# Patient Record
Sex: Female | Born: 1985
Health system: Southern US, Community
[De-identification: ages and names within clinical notes are randomized; demographics above are authoritative.]

## PROBLEM LIST (undated history)

## (undated) ENCOUNTER — Inpatient Hospital Stay (HOSPITAL_COMMUNITY): Payer: Self-pay

## (undated) DIAGNOSIS — L989 Disorder of the skin and subcutaneous tissue, unspecified: Secondary | ICD-10-CM

## (undated) DIAGNOSIS — H8091 Unspecified otosclerosis, right ear: Secondary | ICD-10-CM

## (undated) HISTORY — PX: STAPEDECTOMY: SHX2435

---

## 2013-11-21 ENCOUNTER — Encounter (HOSPITAL_COMMUNITY): Payer: Self-pay | Admitting: Emergency Medicine

## 2013-11-21 ENCOUNTER — Emergency Department (INDEPENDENT_AMBULATORY_CARE_PROVIDER_SITE_OTHER): Payer: Medicaid Other

## 2013-11-21 ENCOUNTER — Emergency Department (HOSPITAL_COMMUNITY)
Admission: EM | Admit: 2013-11-21 | Discharge: 2013-11-21 | Disposition: A | Discharge status: Home / Self Care | Payer: Medicaid Other | Source: Home / Self Care | Attending: Family Medicine | Admitting: Family Medicine

## 2013-11-21 DIAGNOSIS — H9209 Otalgia, unspecified ear: Secondary | ICD-10-CM

## 2013-11-21 DIAGNOSIS — S63509A Unspecified sprain of unspecified wrist, initial encounter: Secondary | ICD-10-CM

## 2013-11-21 DIAGNOSIS — W19XXXA Unspecified fall, initial encounter: Secondary | ICD-10-CM

## 2013-11-21 DIAGNOSIS — S63501A Unspecified sprain of right wrist, initial encounter: Secondary | ICD-10-CM

## 2013-11-21 DIAGNOSIS — H9201 Otalgia, right ear: Secondary | ICD-10-CM

## 2013-11-21 MED ORDER — CIPROFLOXACIN-DEXAMETHASONE 0.3-0.1 % OT SUSP: 4.0000 [drp] | Freq: Two times a day (BID) | OTIC | Status: DC

## 2013-11-21 MED ORDER — DIPHENHYDRAMINE HCL 50 MG/ML IJ SOLN
INTRAMUSCULAR | Status: AC
  Filled 2013-11-21: qty 1

## 2013-11-21 MED ORDER — DICLOFENAC SODIUM 50 MG PO TBEC: 50.0000 mg | DELAYED_RELEASE_TABLET | Freq: Two times a day (BID) | ORAL | Status: DC | PRN

## 2013-11-21 MED ORDER — ANTIPYRINE-BENZOCAINE 5.4-1.4 % OT SOLN: 3.0000 [drp] | OTIC | Status: DC | PRN

## 2013-11-21 NOTE — Discharge Instructions (Signed)
Thank you for coming in today. [facility].  Your doctor's office will need to authorize the referral.  GOTO THIS ADDRESS TODAY [facility] [address] [place], [place] [id number] [phone]  Use the ear drops for 1 week.   Use the brace for 1 week.  Take diclofenac the neck is needed for pain.   Otalgia The most common reason for this in children is an infection of the middle ear. Pain from the middle ear is usually caused by a build-up of fluid and pressure behind the eardrum. Pain from an earache can be sharp, dull, or burning. The pain may be temporary or constant. The middle ear is connected to the nasal passages by a short narrow tube called the Eustachian tube. The Eustachian tube allows fluid to drain out of the middle ear, and helps keep the pressure in your ear equalized. CAUSES  A cold or allergy can block the Eustachian tube with inflammation and the build-up of secretions. This is especially likely in small children, because their Eustachian tube is shorter and more horizontal. When the Eustachian tube closes, the normal flow of fluid from the middle ear is stopped. Fluid can accumulate and cause stuffiness, pain, hearing loss, and an ear infection if germs start growing in this area. SYMPTOMS  The symptoms of an ear infection may include fever, ear pain, fussiness, increased crying, and irritability. Many children will have temporary and minor hearing loss during and right after an ear infection. Permanent hearing loss is rare, but the risk increases the more infections a child has. Other causes of ear pain include retained water in the outer ear canal from swimming and bathing. Ear pain in adults is less likely to be from an ear infection. Ear pain may be referred from other locations. Referred pain may be from the joint between your jaw and the skull. It may also come from a tooth problem or problems in  the neck. Other causes of ear pain include:  A foreign body in the ear.  Outer ear infection.  Sinus infections.  Impacted ear wax.  Ear injury.  Arthritis of the jaw or TMJ problems.  Middle ear infection.  Tooth infections.  Sore throat with pain to the ears. DIAGNOSIS  Your caregiver can usually make the diagnosis by examining you. Sometimes other special studies, including x-rays and lab work may be necessary. TREATMENT   If antibiotics were prescribed, use them as directed and finish them even if you or your child's symptoms seem to be improved.  Sometimes PE tubes are needed in children. These are little plastic tubes which are put into the eardrum during a simple surgical procedure. They allow fluid to drain easier and allow the pressure in the middle ear to equalize. This helps relieve the ear pain caused by pressure changes. HOME CARE INSTRUCTIONS   Only take over-the-counter or prescription medicines for pain, discomfort, or fever as directed by your caregiver. DO NOT GIVE CHILDREN ASPIRIN because of the association of Reye's Syndrome in children taking aspirin.  Use a cold pack applied to the outer ear for 15-20 minutes, 03-04 times per day or as needed may reduce pain. Do not apply ice directly to the skin. You may cause frost bite.  Over-the-counter ear drops used as directed may be effective. Your caregiver may sometimes prescribe ear drops.  Resting in an upright position may help reduce pressure in the middle ear and relieve  pain.  Ear pain caused by rapidly descending from high altitudes can be relieved by swallowing or chewing gum. Allowing infants to suck on a bottle during airplane travel can help.  Do not smoke in the house or near children. If you are unable to quit smoking, smoke outside.  Control allergies. SEEK IMMEDIATE MEDICAL CARE IF:   You or your child are becoming sicker.  Pain or fever relief is not obtained with medicine.  You or your  child's symptoms (pain, fever, or irritability) do not improve within 24 to 48 hours or as instructed.  Severe pain suddenly stops hurting. This may indicate a ruptured eardrum.  You or your children develop new problems such as severe headaches, stiff neck, difficulty swallowing, or swelling of the face or around the ear. Document Released: 01/01/2004 Document Revised: 08/08/2011 Document Reviewed: 05/07/2008 [facility] Patient Information 2015 [employer] [place]. This information is not intended to replace advice given to you by your health care provider. Make sure you discuss any questions you have with your health care provider.   Wrist Sprain with Rehab A sprain is an injury in which a ligament that maintains the proper alignment of a joint is partially or completely torn. The ligaments of the wrist are susceptible to sprains. Sprains are classified into three categories. Grade 1 sprains cause pain, but the tendon is not lengthened. Grade 2 sprains include a lengthened ligament because the ligament is stretched or partially ruptured. With grade 2 sprains there is still function, although the function may be diminished. Grade 3 sprains are characterized by a complete tear of the tendon or muscle, and function is usually impaired. SYMPTOMS   Pain tenderness, inflammation, and/or bruising (contusion) of the injury.  A "pop" or tear felt and/or heard at the time of injury.  Decreased wrist function. CAUSES  A wrist sprain occurs when a force is placed on one or more ligaments that is greater than it/they can withstand. Common mechanisms of injury include:  Catching a ball with you hands.  Repetitive and/ or strenuous extension or flexion of the wrist. RISK INCREASES WITH:  Previous wrist injury.  Contact sports (boxing or wrestling).  Activities in which falling is common.  Poor strength and flexibility.  Improperly fitted or padded protective equipment. PREVENTION  Warm up and  stretch properly before activity.  Allow for adequate recovery between workouts.  Maintain physical fitness:  Strength, flexibility, and endurance.  Cardiovascular fitness.  Protect the wrist joint by limiting its motion with the use of taping, braces, or splints.  Protect the wrist after injury for 6 to 12 months. PROGNOSIS  The prognosis for wrist sprains depends on the degree of injury. Grade 1 sprains require 2 to 6 weeks of treatment. Grade 2 sprains require 6 to 8 weeks of treatment, and grade 3 sprains require up to 12 weeks.  RELATED COMPLICATIONS   Prolonged healing time, if improperly treated or re-injured.  Recurrent symptoms that result in a chronic problem.  Injury to nearby structures (bone, cartilage, nerves, or tendons).  Arthritis of the wrist.  Inability to compete in athletics at a high level.  Wrist stiffness or weakness.  Progression to a complete rupture of the ligament. TREATMENT  Treatment initially involves resting from any activities that aggravate the symptoms, and the use of ice and medications to help reduce pain and inflammation. Your caregiver may recommend immobilizing the wrist for a period of time in order to reduce stress on the ligament and allow for healing. After  immobilization it is important to perform strengthening and stretching exercises to help regain strength and a full range of motion. These exercises may be completed at home or with a therapist. Surgery is not usually required for wrist sprains, unless the ligament has been ruptured (grade 3 sprain). MEDICATION   If pain medication is necessary, then nonsteroidal anti-inflammatory medications, such as aspirin and ibuprofen, or other minor pain relievers, such as acetaminophen, are often recommended.  Do not take pain medication for 7 days before surgery.  Prescription pain relievers may be given if deemed necessary by your caregiver. Use only as directed and only as much as you  need. HEAT AND COLD  Cold treatment (icing) relieves pain and reduces inflammation. Cold treatment should be applied for 10 to 15 minutes every 2 to 3 hours for inflammation and pain and immediately after any activity that aggravates your symptoms. Use ice packs or massage the area with a piece of ice (ice massage).  Heat treatment may be used prior to performing the stretching and strengthening activities prescribed by your caregiver, physical therapist, or athletic trainer. Use a heat pack or soak your injury in warm water. SEEK MEDICAL CARE IF:  Treatment seems to offer no benefit, or the condition worsens.  Any medications produce adverse side effects. EXERCISES RANGE OF MOTION (ROM) AND STRETCHING EXERCISES - Wrist Sprain  These exercises may help you when beginning to rehabilitate your injury. Your symptoms may resolve with or without further involvement from your physician, physical therapist or athletic trainer. While completing these exercises, remember:   Restoring tissue flexibility helps normal motion to return to the joints. This allows healthier, less painful movement and activity.  An effective stretch should be held for at least 30 seconds.  A stretch should never be painful. You should only feel a gentle lengthening or release in the stretched tissue. RANGE OF MOTION - Wrist Flexion, Active-Assisted  Extend your right / left elbow with your fingers pointing down.*  Gently pull the back of your hand towards you until you feel a gentle stretch on the top of your forearm.  Hold this position for __________ seconds. Repeat __________ times. Complete this exercise __________ times per day.  *If directed by your physician, physical therapist or athletic trainer, complete this stretch with your elbow bent rather than extended. RANGE OF MOTION - Wrist Extension, Active-Assisted  Extend your right / left elbow and turn your palm upwards.*  Gently pull your palm/fingertips  back so your wrist extends and your fingers point more toward the ground.  You should feel a gentle stretch on the inside of your forearm.  Hold this position for __________ seconds. Repeat __________ times. Complete this exercise __________ times per day. *If directed by your physician, physical therapist or athletic trainer, complete this stretch with your elbow bent, rather than extended. RANGE OF MOTION - Supination, Active  Stand or sit with your elbows at your side. Bend your right / left elbow to 90 degrees.  Turn your palm upward until you feel a gentle stretch on the inside of your forearm.  Hold this position for __________ seconds. Slowly release and return to the starting position. Repeat __________ times. Complete this stretch __________ times per day.  RANGE OF MOTION - Pronation, Active  Stand or sit with your elbows at your side. Bend your right / left elbow to 90 degrees.  Turn your palm downward until you feel a gentle stretch on the top of your forearm.  Hold this  position for __________ seconds. Slowly release and return to the starting position. Repeat __________ times. Complete this stretch __________ times per day.  STRETCH - Wrist Flexion  Place the back of your right / left hand on a tabletop leaving your elbow slightly bent. Your fingers should point away from your body.  Gently press the back of your hand down onto the table by straightening your elbow. You should feel a stretch on the top of your forearm.  Hold this position for __________ seconds. Repeat __________ times. Complete this stretch __________ times per day.  STRETCH - Wrist Extension  Place your right / left fingertips on a tabletop leaving your elbow slightly bent. Your fingers should point backwards.  Gently press your fingers and palm down onto the table by straightening your elbow. You should feel a stretch on the inside of your forearm.  Hold this position for __________  seconds. Repeat __________ times. Complete this stretch __________ times per day.  STRENGTHENING EXERCISES - Wrist Sprain These exercises may help you when beginning to rehabilitate your injury. They may resolve your symptoms with or without further involvement from your physician, physical therapist or athletic trainer. While completing these exercises, remember:   Muscles can gain both the endurance and the strength needed for everyday activities through controlled exercises.  Complete these exercises as instructed by your physician, physical therapist or athletic trainer. Progress with the resistance and repetition exercises only as your caregiver advises. STRENGTH - Wrist Flexors  Sit with your right / left forearm palm-up and fully supported. Your elbow should be resting below the height of your shoulder. Allow your wrist to extend over the edge of the surface.  Loosely holding a __________ weight or a piece of rubber exercise band/tubing, slowly curl your hand up toward your forearm.  Hold this position for __________ seconds. Slowly lower the wrist back to the starting position in a controlled manner. Repeat __________ times. Complete this exercise __________ times per day.  STRENGTH - Wrist Extensors  Sit with your right / left forearm palm-down and fully supported. Your elbow should be resting below the height of your shoulder. Allow your wrist to extend over the edge of the surface.  Loosely holding a __________ weight or a piece of rubber exercise band/tubing, slowly curl your hand up toward your forearm.  Hold this position for __________ seconds. Slowly lower the wrist back to the starting position in a controlled manner. Repeat __________ times. Complete this exercise __________ times per day.  STRENGTH - Ulnar Deviators  Stand with a ____________________ weight in your right / left hand, or sit holding on to the rubber exercise band/tubing with your opposite arm  supported.  Move your wrist so that your pinkie travels toward your forearm and your thumb moves away from your forearm.  Hold this position for __________ seconds and then slowly lower the wrist back to the starting position. Repeat __________ times. Complete this exercise __________ times per day STRENGTH - Radial Deviators  Stand with a ____________________ weight in your  right / left hand, or sit holding on to the rubber exercise band/tubing with your arm supported.  Raise your hand upward in front of you or pull up on the rubber tubing.  Hold this position for __________ seconds and then slowly lower the wrist back to the starting position. Repeat __________ times. Complete this exercise __________ times per day. STRENGTH - Forearm Supinators  Sit with your right / left forearm supported on a table, keeping  your elbow below shoulder height. Rest your hand over the edge, palm down.  Gently grip a hammer or a soup ladle.  Without moving your elbow, slowly turn your palm and hand upward to a "thumbs-up" position.  Hold this position for __________ seconds. Slowly return to the starting position. Repeat __________ times. Complete this exercise __________ times per day.  STRENGTH - Forearm Pronators  Sit with your right / left forearm supported on a table, keeping your elbow below shoulder height. Rest your hand over the edge, palm up.  Gently grip a hammer or a soup ladle.  Without moving your elbow, slowly turn your palm and hand upward to a "thumbs-up" position.  Hold this position for __________ seconds. Slowly return to the starting position. Repeat __________ times. Complete this exercise __________ times per day.  STRENGTH - Grip  Grasp a tennis ball, a dense sponge, or a large, rolled sock in your hand.  Squeeze as hard as you can without increasing any pain.  Hold this position for __________ seconds. Release your grip slowly. Repeat __________ times. Complete  this exercise __________ times per day.  Document Released: 05/16/2005 Document Revised: 08/08/2011 Document Reviewed: 08/28/2008 [facility] Patient Information 2015 [place], [place]. This information is not intended to replace advice given to you by your health care provider. Make sure you discuss any questions you have with your health care provider.

## 2013-11-21 NOTE — ED Notes (Signed)
C/o ear pain See physician note

## 2013-11-21 NOTE — ED Provider Notes (Signed)
Jonai Fredda Hammedal Almodars is a 28 y.o. female who presents to Urgent Care today for right wrist and right ear pain.  1) right wrist pain: Patient fell onto an outstretched right wrist yesterday. She notes pain at the distal radius. The pain is worse with activity better with rest. No radiating pain weakness or numbness. No medications tried  2) right ear pain: Patient is worsening right ear pain and decreased hearing following a fall yesterday where she hit the right side of her head.   History reviewed. No pertinent past medical history. History  Substance Use Topics  . Smoking status: Not on file  . Smokeless tobacco: Not on file  . Alcohol Use: Not on file   ROS as above Medications: Current Facility-Administered Medications  Medication Dose Route Frequency Provider Last Rate Last Dose  . antipyrine-benzocaine (AURALGAN) otic solution 3-4 drop  3-4 drop Right Ear Q2H PRN Rodolph BongEvan S Corey, MD       Current Outpatient Prescriptions  Medication Sig Dispense Refill  . ciprofloxacin-dexamethasone (CIPRODEX) otic suspension Place 4 drops into the right ear 2 (two) times daily. 1 week  7.5 mL  0  . diclofenac (VOLTAREN) 50 MG EC tablet Take 1 tablet (50 mg total) by mouth 2 (two) times daily as needed.  60 tablet  0    Exam:  BP 116/83  Pulse 81  Temp(Src) 98.1 F (36.7 C) (Oral)  Resp 16  SpO2 100%  LMP 11/20/2013 Gen: Well NAD HEENT: EOMI,  MMM right ear canal is normal-appearing. The tympanic membrane is retracted but otherwise normal appearing. Patient has mild tenderness with motion of the ear. Nontender mastoids. No bruits in seen. Left ear is normal appearing. Lungs: Normal work of breathing. CTABL Heart: RRR no MRG Abd: NABS, Soft. NT, ND Exts: Brisk capillary refill, warm and well perfused.  Right wrist: Normal-appearing no swelling or ecchymosis. Tender palpation distal radius. Nontender anatomical snuff box. Grip strength Refill pulses and sensation are intact. Motion is  intact.  No results found for this or any previous visit (from the past 24 hour(s)). Dg Wrist Complete Right  11/21/2013   CLINICAL DATA:  Right wrist pain after fall.  EXAM: RIGHT WRIST - COMPLETE 3+ VIEW  COMPARISON:  None.  FINDINGS: There is no evidence of fracture or dislocation. There is no evidence of arthropathy or other focal bone abnormality. Soft tissues are unremarkable.  IMPRESSION: Normal right wrist.   Electronically Signed   By: Roque LiasJames  Green M.D.   On: 11/21/2013 12:23    Assessment and Plan: 28 y.o. female with  1) right wrist sprain: No evidence of fracture. Nontender in the snuff box. Plan for wrist brace and diclofenac as needed 2) right ear pain: Unclear etiology. We'll try to use Ciprodex eardrops as this may help some. [facility].  Discussed warning signs or symptoms. Please see discharge instructions. Patient expresses understanding.    Rodolph BongEvan S Corey, MD 11/21/13 1321

## 2013-12-09 ENCOUNTER — Encounter (HOSPITAL_COMMUNITY): Payer: Self-pay | Admitting: Emergency Medicine

## 2013-12-09 ENCOUNTER — Emergency Department (HOSPITAL_COMMUNITY)
Admission: EM | Admit: 2013-12-09 | Discharge: 2013-12-09 | Disposition: A | Discharge status: Home / Self Care | Payer: Medicaid Other | Attending: Emergency Medicine | Admitting: Emergency Medicine

## 2013-12-09 DIAGNOSIS — M25531 Pain in right wrist: Secondary | ICD-10-CM

## 2013-12-09 DIAGNOSIS — M79609 Pain in unspecified limb: Secondary | ICD-10-CM | POA: Diagnosis present

## 2013-12-09 DIAGNOSIS — M25539 Pain in unspecified wrist: Secondary | ICD-10-CM | POA: Diagnosis not present

## 2013-12-09 DIAGNOSIS — M25532 Pain in left wrist: Secondary | ICD-10-CM

## 2013-12-09 DIAGNOSIS — L723 Sebaceous cyst: Secondary | ICD-10-CM | POA: Diagnosis not present

## 2013-12-09 MED ORDER — MELOXICAM 7.5 MG PO TABS: 15.0000 mg | ORAL_TABLET | Freq: Every day | ORAL | Status: DC

## 2013-12-09 NOTE — ED Notes (Signed)
Presents with one year of cysts on posterior bilateral hands causing pain.

## 2013-12-09 NOTE — Discharge Instructions (Signed)
Arthralgia °Your caregiver has diagnosed you as suffering from an arthralgia. Arthralgia means there is pain in a joint. This can come from many reasons including: °· Bruising the joint which causes soreness (inflammation) in the joint. °· Wear and tear on the joints which occur as we grow older (osteoarthritis). °· Overusing the joint. °· Various forms of arthritis. °· Infections of the joint. °Regardless of the cause of pain in your joint, most of these different pains respond to anti-inflammatory drugs and rest. The exception to this is when a joint is infected, and these cases are treated with antibiotics, if it is a bacterial infection. °HOME CARE INSTRUCTIONS  °· Rest the injured area for as long as directed by your caregiver. Then slowly start using the joint as directed by your caregiver and as the pain allows. Crutches as directed may be useful if the ankles, knees or hips are involved. If the knee was splinted or casted, continue use and care as directed. If an stretchy or elastic wrapping bandage has been applied today, it should be removed and re-applied every 3 to 4 hours. It should not be applied tightly, but firmly enough to keep swelling down. Watch toes and feet for swelling, bluish discoloration, coldness, numbness or excessive pain. If any of these problems (symptoms) occur, remove the ace bandage and re-apply more loosely. If these symptoms persist, contact your caregiver or return to this location. °· For the first 24 hours, keep the injured extremity elevated on pillows while lying down. °· Apply ice for 15-20 minutes to the sore joint every couple hours while awake for the first half day. Then 03-04 times per day for the first 48 hours. Put the ice in a plastic bag and place a towel between the bag of ice and your skin. °· Wear any splinting, casting, elastic bandage applications, or slings as instructed. °· Only take over-the-counter or prescription medicines for pain, discomfort, or fever as  directed by your caregiver. Do not use aspirin immediately after the injury unless instructed by your physician. Aspirin can cause increased bleeding and bruising of the tissues. °· If you were given crutches, continue to use them as instructed and do not resume weight bearing on the sore joint until instructed. °Persistent pain and inability to use the sore joint as directed for more than 2 to 3 days are warning signs indicating that you should see a caregiver for a follow-up visit as soon as possible. Initially, a hairline fracture (break in bone) may not be evident on X-rays. Persistent pain and swelling indicate that further evaluation, non-weight bearing or use of the joint (use of crutches or slings as instructed), or further X-rays are indicated. X-rays may sometimes not show a small fracture until a week or 10 days later. Make a follow-up appointment with your own caregiver or one to whom we have referred you. A radiologist (specialist in reading X-rays) may read your X-rays. Make sure you know how you are to obtain your X-ray results. Do not assume everything is normal if you do not hear from us. °SEEK MEDICAL CARE IF: °Bruising, swelling, or pain increases. °SEEK IMMEDIATE MEDICAL CARE IF:  °· Your fingers or toes are numb or blue. °· The pain is not responding to medications and continues to stay the same or get worse. °· The pain in your joint becomes severe. °· You develop a fever over 102° F (38.9° C). °· It becomes impossible to move or use the joint. °MAKE SURE YOU:  °·   Understand these instructions.  Will watch your condition.  Will get help right away if you are not doing well or get worse. Document Released: 05/16/2005 Document Revised: 08/08/2011 Document Reviewed: 01/02/2008 [facility] Patient Information 2015 [place], [place]. This information is not intended to replace advice given to you by your health care provider. Make sure you discuss any questions you have with your health care  provider. Ganglion Cyst A ganglion cyst is a noncancerous, fluid-filled lump that occurs near joints or tendons. The ganglion cyst grows out of a joint or the lining of a tendon. It most often develops in the hand or wrist but can also develop in the shoulder, elbow, hip, knee, ankle, or foot. The round or oval ganglion can be pea sized or larger than a grape. Increased activity may enlarge the size of the cyst because more fluid starts to build up.  CAUSES  It is not completely known what causes a ganglion cyst to grow. However, it may be related to:  Inflammation or irritation around the joint.  An injury.  Repetitive movements or overuse.  Arthritis. SYMPTOMS  A lump most often appears in the hand or wrist, but can occur in other areas of the body. Generally, the lump is painless without other symptoms. However, sometimes pain can be felt during activity or when pressure is applied to the lump. The lump may even be tender to the touch. Tingling, pain, numbness, or muscle weakness can occur if the ganglion cyst presses on a nerve. Your grip may be weak and you may have less movement in your joints.  DIAGNOSIS  Ganglion cysts are most often diagnosed based on a physical exam, noting where the cyst is and how it looks. Your caregiver will feel the lump and may shine a light alongside it. If it is a ganglion, a light often shines through it. Your caregiver may order an X-ray, ultrasound, or MRI to rule out other conditions. TREATMENT  Ganglions usually go away on their own without treatment. If pain or other symptoms are involved, treatment may be needed. Treatment is also needed if the ganglion limits your movement or if it gets infected. Treatment options include:  Wearing a wrist or finger brace or splint.  Taking anti-inflammatory medicine.  Draining fluid from the lump with a needle (aspiration).  Injecting a steroid into the joint.  Surgery to remove the ganglion cyst and its stalk  that is attached to the joint or tendon. However, ganglion cysts can grow back. HOME CARE INSTRUCTIONS   Do not press on the ganglion, poke it with a needle, or hit it with a heavy object. You may rub the lump gently and often. Sometimes fluid moves out of the cyst.  Only take medicines as directed by your caregiver.  Wear your brace or splint as directed by your caregiver. SEEK MEDICAL CARE IF:   Your ganglion becomes larger or more painful.  You have increased redness, red streaks, or swelling.  You have pus coming from the lump.  You have weakness or numbness in the affected area. MAKE SURE YOU:   Understand these instructions.  Will watch your condition.  Will get help right away if you are not doing well or get worse. Document Released: 05/13/2000 Document Revised: 02/08/2012 Document Reviewed: 07/10/2007 [facility] Patient Information 2015 [place]ExitCare, [place]LL[place]. This information is not intended to replace advice given to you by your health care provider. Make sure you discuss any questions you have with your health care provider.

## 2013-12-09 NOTE — ED Provider Notes (Signed)
CSN: [id number]     Arrival date & time 12/09/13  2206 History  This chart was scribed for non-physician practitioner, Roxy Horsemanobert Ianmichael Amescua, PA-C working with Derwood KaplanAnkit Nanavati, MD by Greggory StallionKayla Andersen, ED scribe. This patient was seen in room TR11C/TR11C and the patient's care was started at 10:49 PM.   Chief Complaint  Patient presents with  . Hand Pain   The history is provided by the patient. A language interpreter was used (pt's husband).   HPI Comments: Terri Simpson is a 28 y.o. female who presents to the Emergency Department complaining of cysts to bilateral hands that started one year ago. States they have recently grown in size and increased in pain, right worse than left. Movements worsen pain. Pt went to an urgent care one month ago where an xray was done. She was referred to a specialist but was told she couldn't be seen there because she had medicaid. Pt is also complaining of intermittent right foot pain that started one year ago after breaking a bone.   History reviewed. No pertinent past medical history. History reviewed. No pertinent past surgical history. History reviewed. No pertinent family history. History  Substance Use Topics  . Smoking status: Not on file  . Smokeless tobacco: Not on file  . Alcohol Use: Not on file   OB History   Grav Para Term Preterm Abortions TAB SAB Ect Mult Living                 Review of Systems  Constitutional: Negative for fever.  HENT: Negative for congestion.   Eyes: Negative for redness.  Respiratory: Negative for shortness of breath.   Cardiovascular: Negative for chest pain.  Gastrointestinal: Negative for abdominal distention.  Musculoskeletal: Positive for arthralgias.  Skin: Negative for rash.       Cysts.  Neurological: Negative for speech difficulty.  Psychiatric/Behavioral: Negative for confusion.   Allergies  Review of patient's allergies indicates no known allergies.  Home Medications   Prior to Admission  medications   Not on File   BP 105/77  Pulse 92  Temp(Src) 97.7 F (36.5 C) (Oral)  Resp 16  Ht 5' (1.524 m)  Wt 160 lb (72.576 kg)  BMI 31.25 kg/m2  SpO2 100%  LMP 11/20/2013  Physical Exam  Nursing note and vitals reviewed. Constitutional: She is oriented to person, place, and time. She appears well-developed and well-nourished. No distress.  HENT:  Head: Normocephalic and atraumatic.  Eyes: Conjunctivae and EOM are normal.  Cardiovascular: Normal rate, regular rhythm and intact distal pulses.   Brisk capillary refill.  Pulmonary/Chest: Effort normal and breath sounds normal. No stridor. No respiratory distress.  Abdominal: She exhibits no distension.  Musculoskeletal: She exhibits no edema.  Posterior bilateral wrists are tender to palpation. No obvious bony abnormality or deformity. Possibly a ganglion cyst. ROM and strength 5/5.   Neurological: She is alert and oriented to person, place, and time. No cranial nerve deficit.  Sensation intact.  Skin: Skin is warm and dry.  Psychiatric: She has a normal mood and affect.    ED Course  Procedures (including critical care time)  DIAGNOSTIC STUDIES: Oxygen Saturation is 100% on RA, normal by my interpretation.    COORDINATION OF CARE: 10:52 PM-Discussed treatment plan which includes pain medication with pt at bedside and pt agreed to plan. Will give pt hand surgery referral and advised her to follow up.   Labs Review Labs Reviewed - No data to display  Imaging Review No results  found.   EKG Interpretation None      MDM   Final diagnoses:  Pain in both wrists    Patient with wrist pain. No bony deformity. No evidence of infection, or abscess. Doubt carpal tunnel. Recommend orthopedic followup.  I personally performed the services described in this documentation, which was scribed in my presence. The recorded information has been reviewed and is accurate.  Roxy Horseman, PA-C 12/10/13 [phone]

## 2013-12-10 NOTE — ED Provider Notes (Signed)
Medical screening examination/treatment/procedure(s) were performed by non-physician practitioner and as supervising physician I was immediately available for consultation/collaboration.   EKG Interpretation None       Derwood KaplanAnkit Tyger Oka, MD 12/10/13 0134

## 2014-03-29 ENCOUNTER — Emergency Department (HOSPITAL_COMMUNITY)
Admission: EM | Admit: 2014-03-29 | Discharge: 2014-03-29 | Disposition: A | Discharge status: Home / Self Care | Payer: Medicaid Other | Source: Home / Self Care

## 2014-03-29 ENCOUNTER — Encounter (HOSPITAL_COMMUNITY): Payer: Self-pay | Admitting: Emergency Medicine

## 2014-03-29 DIAGNOSIS — J029 Acute pharyngitis, unspecified: Secondary | ICD-10-CM

## 2014-03-29 DIAGNOSIS — R0982 Postnasal drip: Secondary | ICD-10-CM

## 2014-03-29 LAB — POCT RAPID STREP A: STREPTOCOCCUS, GROUP A SCREEN (DIRECT): NEGATIVE

## 2014-03-29 MED ORDER — TRAMADOL HCL 50 MG PO TABS: 50.0000 mg | ORAL_TABLET | Freq: Four times a day (QID) | ORAL | Status: DC | PRN

## 2014-03-29 NOTE — Discharge Instructions (Signed)
Pharyngitis Cepacol lozenges Lots of cool liquids Ibuprofen 600 mg every 6 hours as needed May continue the Nyquil Tramadol for pain Pharyngitis is redness, pain, and swelling (inflammation) of your pharynx.  CAUSES  Pharyngitis is usually caused by infection. Most of the time, these infections are from viruses (viral) and are part of a cold. However, sometimes pharyngitis is caused by bacteria (bacterial). Pharyngitis can also be caused by allergies. Viral pharyngitis may be spread from person to person by coughing, sneezing, and personal items or utensils (cups, forks, spoons, toothbrushes). Bacterial pharyngitis may be spread from person to person by more intimate contact, such as kissing.  SIGNS AND SYMPTOMS  Symptoms of pharyngitis include:   Sore throat.   Tiredness (fatigue).   Low-grade fever.   Headache.  Joint pain and muscle aches.  Skin rashes.  Swollen lymph nodes.  Plaque-like film on throat or tonsils (often seen with bacterial pharyngitis). DIAGNOSIS  Your health care provider will ask you questions about your illness and your symptoms. Your medical history, along with a physical exam, is often all that is needed to diagnose pharyngitis. Sometimes, a rapid strep test is done. Other lab tests may also be done, depending on the suspected cause.  TREATMENT  Viral pharyngitis will usually get better in 3-4 days without the use of medicine. Bacterial pharyngitis is treated with medicines that kill germs (antibiotics).  HOME CARE INSTRUCTIONS   Drink enough water and fluids to keep your urine clear or pale yellow.   Only take over-the-counter or prescription medicines as directed by your health care provider:   If you are prescribed antibiotics, make sure you finish them even if you start to feel better.   Do not take aspirin.   Get lots of rest.   Gargle with 8 oz of salt water ( tsp of salt per 1 qt of water) as often as every 1-2 hours to soothe your  throat.   Throat lozenges (if you are not at risk for choking) or sprays may be used to soothe your throat. SEEK MEDICAL CARE IF:   You have large, tender lumps in your neck.  You have a rash.  You cough up green, yellow-brown, or bloody spit. SEEK IMMEDIATE MEDICAL CARE IF:   Your neck becomes stiff.  You drool or are unable to swallow liquids.  You vomit or are unable to keep medicines or liquids down.  You have severe pain that does not go away with the use of recommended medicines.  You have trouble breathing (not caused by a stuffy nose). MAKE SURE YOU:   Understand these instructions.  Will watch your condition.  Will get help right away if you are not doing well or get worse. Document Released: 05/16/2005 Document Revised: 03/06/2013 Document Reviewed: 01/21/2013 [facility]ExitCare Patient Information 2015 [place]ExitCare, [place]LLC. This information is not intended to replace advice given to you by your health care provider. Make sure you discuss any questions you have with your health care provider.  Salt Water Gargle This solution will help make your mouth and throat feel better. HOME CARE INSTRUCTIONS   Mix 1 teaspoon of salt in 8 ounces of warm water.  Gargle with this solution as much or often as you need or as directed. Swish and gargle gently if you have any sores or wounds in your mouth.  Do not swallow this mixture. Document Released: 02/18/2004 Document Revised: 08/08/2011 Document Reviewed: 07/11/2008 [facility] Patient Information 2015 [place]ExitCare, [place]LLC. This information is not intended to replace advice  given to you by your health care provider. Make sure you discuss any questions you have with your health care provider.

## 2014-03-29 NOTE — ED Provider Notes (Signed)
Medical screening examination/treatment/procedure(s) were performed by resident physician or non-physician practitioner and as supervising physician I was immediately available for consultation/collaboration.   Lancer Thurner DOUGLAS MD.   Will Schier D Amoreena Neubert, MD 03/29/14 1345 

## 2014-03-29 NOTE — ED Notes (Signed)
Pt states that for 2 days she has had a sore throat she has been barely been able to eat drink r/t having a sore throat. Pt is in no acute distress at this time.

## 2014-03-29 NOTE — ED Provider Notes (Signed)
CSN: [id number]     Arrival date & time 03/29/14  1201 History   First MD Initiated Contact with Patient 03/29/14 1303     Chief Complaint  Patient presents with  . Sore Throat   (Consider location/radiation/quality/duration/timing/severity/associated sxs/prior Treatment) HPI Comments: 28 year old female accompanied by husband complaining of a sore throat for 2 days. Husband states that she feels as though she has had a fever but is not been measured. The pain in the throat keeps her from sleeping. She also complains of PND, dry throat that is worse in the morning. She has a recent history of an ear infection for which she had surgery.   History reviewed. No pertinent past medical history. History reviewed. No pertinent past surgical history. History reviewed. No pertinent family history. History  Substance Use Topics  . Smoking status: Never Smoker   . Smokeless tobacco: Not on file  . Alcohol Use: No   OB History   Grav Para Term Preterm Abortions TAB SAB Ect Mult Living                 Review of Systems  Constitutional: Positive for fever, activity change, appetite change and fatigue. Negative for chills.  HENT: Positive for postnasal drip, rhinorrhea and sore throat. Negative for facial swelling.   Eyes: Negative.   Respiratory: Negative.   Cardiovascular: Negative.   Musculoskeletal: Negative for neck pain and neck stiffness.  Skin: Negative for pallor and rash.  Neurological: Negative.     Allergies  Review of patient's allergies indicates no known allergies.  Home Medications   Prior to Admission medications   Medication Sig Start Date End Date Taking? Authorizing Provider  traMADol (ULTRAM) 50 MG tablet Take 1 tablet (50 mg total) by mouth every 6 (six) hours as needed. For throat pain 03/29/14   Hayden Rasmussenavid Jamie Belger, NP   BP 111/67  Pulse 74  Temp(Src) 97.9 F (36.6 C) (Oral)  Resp 16  SpO2 100%  LMP 03/23/2014 Physical Exam  Nursing note and vitals  reviewed. Constitutional: She is oriented to person, place, and time. She appears well-developed and well-nourished. No distress.  HENT:  Mouth/Throat: No oropharyngeal exudate.  Bilateral TMs are pearly gray transplant and without signs of effusion. There is mild retraction bilaterally.  Oral pharynx is injected. There is no swelling or tonsillar enlargement. Scant clear PND  Eyes: Conjunctivae and EOM are normal.  Neck: Normal range of motion. Neck supple.  Cardiovascular: Normal rate, regular rhythm and normal heart sounds.   Pulmonary/Chest: Effort normal and breath sounds normal. No respiratory distress. She has no wheezes. She has no rales.  Musculoskeletal: Normal range of motion. She exhibits no edema.  Lymphadenopathy:    She has no cervical adenopathy.  Neurological: She is alert and oriented to person, place, and time.  Skin: Skin is warm and dry. No rash noted.  Psychiatric: She has a normal mood and affect.    ED Course  Procedures (including critical care time) Labs Review Labs Reviewed  POCT RAPID STREP A ([facility] CARE ONLY)   Results for orders placed during the hospital encounter of 03/29/14  POCT RAPID STREP A ([facility] ONLY)      Result Value Ref Range   Streptococcus, Group A Screen (Direct) NEGATIVE  NEGATIVE     Imaging Review No results found.   MDM   1. Pharyngitis   2. PND (post-nasal drip)    Throat culture pending Appearance is more viral in nature Tramadol 50 mg #  15 prn pain  Cepacol lozenges Lots of cool liquids Ibuprofen 600 mg every 6 hours as needed May continue the Nyquil Tramadol for pain     Hayden Rasmussenavid Gowri Suchan, NP 03/29/14 1323

## 2014-03-30 DIAGNOSIS — H8091 Unspecified otosclerosis, right ear: Secondary | ICD-10-CM

## 2014-03-30 HISTORY — DX: Unspecified otosclerosis, right ear: H80.91

## 2014-03-31 LAB — CULTURE, GROUP A STREP

## 2014-04-17 ENCOUNTER — Ambulatory Visit: Payer: Self-pay | Admitting: Otolaryngology

## 2014-04-17 NOTE — H&P (Signed)
  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 Reason For Visit  Follow up from stapedectomy. Discussed  Doing great, very pleased with the results. The left tympanic membrane looks excellent. Audiogram revealed significant improvement in the conductive hearing loss. Residual conductive loss on the right. She would like to have the right side done before her Medicaid runs out in December. I think this is reasonable. We will schedule. Allergies  No Known Drug Allergies. Current Meds  Naproxen 500 MG Oral Tablet;; RPT Diazepam 5 MG Oral Tablet;take one tablet as needed for severe dizziness, up to 3 times daily.; Rx. 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 Signature  Electronically signed by : Serena ColonelJefry  Selicia Windom  M.D.; 03/07/2014 3:43 PM EST.

## 2014-04-21 ENCOUNTER — Encounter (HOSPITAL_BASED_OUTPATIENT_CLINIC_OR_DEPARTMENT_OTHER): Payer: Self-pay | Admitting: *Deleted

## 2014-04-21 DIAGNOSIS — L989 Disorder of the skin and subcutaneous tissue, unspecified: Secondary | ICD-10-CM

## 2014-04-21 HISTORY — DX: Disorder of the skin and subcutaneous tissue, unspecified: L98.9

## 2014-04-21 NOTE — Pre-Procedure Instructions (Signed)
Arabic interpreter req. for [facility]- and post-op [facility]; [phone] - [phone]

## 2014-04-22 NOTE — Pre-Procedure Instructions (Signed)
Suzi RootsFeryal will be interpreter for pt., per Darel HongJudy at [facility]; please call [phone] if surgery time changes.

## 2014-04-28 ENCOUNTER — Ambulatory Visit (HOSPITAL_BASED_OUTPATIENT_CLINIC_OR_DEPARTMENT_OTHER): Payer: Medicaid Other | Admitting: Anesthesiology

## 2014-04-28 ENCOUNTER — Encounter (HOSPITAL_BASED_OUTPATIENT_CLINIC_OR_DEPARTMENT_OTHER)
Admission: RE | Disposition: A | Discharge status: Home / Self Care | Payer: Self-pay | Source: Ambulatory Visit | Attending: Otolaryngology

## 2014-04-28 ENCOUNTER — Encounter (HOSPITAL_BASED_OUTPATIENT_CLINIC_OR_DEPARTMENT_OTHER): Payer: Self-pay | Admitting: Anesthesiology

## 2014-04-28 ENCOUNTER — Ambulatory Visit (HOSPITAL_BASED_OUTPATIENT_CLINIC_OR_DEPARTMENT_OTHER)
Admission: RE | Admit: 2014-04-28 | Discharge: 2014-04-29 | Disposition: A | Discharge status: Home / Self Care | Payer: Medicaid Other | Source: Ambulatory Visit | Attending: Otolaryngology | Admitting: Otolaryngology

## 2014-04-28 DIAGNOSIS — H8090 Unspecified otosclerosis, unspecified ear: Secondary | ICD-10-CM | POA: Diagnosis present

## 2014-04-28 DIAGNOSIS — H8091 Unspecified otosclerosis, right ear: Secondary | ICD-10-CM | POA: Insufficient documentation

## 2014-04-28 DIAGNOSIS — H908 Mixed conductive and sensorineural hearing loss, unspecified: Secondary | ICD-10-CM | POA: Diagnosis not present

## 2014-04-28 HISTORY — DX: Disorder of the skin and subcutaneous tissue, unspecified: L98.9

## 2014-04-28 HISTORY — PX: STAPEDECTOMY: SHX2435

## 2014-04-28 HISTORY — DX: Unspecified otosclerosis, right ear: H80.91

## 2014-04-28 LAB — POCT HEMOGLOBIN-HEMACUE: Hemoglobin: 13.1 g/dL (ref 12.0–15.0)

## 2014-04-28 SURGERY — STAPEDECTOMY
Anesthesia: General | Laterality: Right

## 2014-04-28 MED ORDER — ESMOLOL HCL 10 MG/ML IV SOLN
INTRAVENOUS | Status: DC | PRN
  Administered 2014-04-28: 10 mg via INTRAVENOUS

## 2014-04-28 MED ORDER — FENTANYL CITRATE 0.05 MG/ML IJ SOLN: 50.0000 ug | INTRAMUSCULAR | Status: DC | PRN

## 2014-04-28 MED ORDER — LACTATED RINGERS IV SOLN: INTRAVENOUS | Status: DC

## 2014-04-28 MED ORDER — METHYLENE BLUE 1 % INJ SOLN
INTRAMUSCULAR | Status: AC
  Filled 2014-04-28: qty 10

## 2014-04-28 MED ORDER — MIDAZOLAM HCL 2 MG/ML PO SYRP: 0.5000 mg/kg | ORAL_SOLUTION | Freq: Once | ORAL | Status: AC | PRN

## 2014-04-28 MED ORDER — EPINEPHRINE HCL 1 MG/ML IJ SOLN
INTRAMUSCULAR | Status: AC
  Filled 2014-04-28: qty 1

## 2014-04-28 MED ORDER — LIDOCAINE HCL (CARDIAC) 20 MG/ML IV SOLN
INTRAVENOUS | Status: DC | PRN
  Administered 2014-04-28: 50 mg via INTRAVENOUS

## 2014-04-28 MED ORDER — CIPROFLOXACIN-DEXAMETHASONE 0.3-0.1 % OT SUSP
OTIC | Status: AC
  Filled 2014-04-28: qty 7.5

## 2014-04-28 MED ORDER — EPINEPHRINE HCL 1 MG/ML IJ SOLN
INTRAMUSCULAR | Status: DC | PRN
  Administered 2014-04-28: .25 mL

## 2014-04-28 MED ORDER — CIPROFLOXACIN-DEXAMETHASONE 0.3-0.1 % OT SUSP
OTIC | Status: DC | PRN
  Administered 2014-04-28: 4 [drp] via OTIC

## 2014-04-28 MED ORDER — OXYCODONE HCL 5 MG PO TABS: 5.0000 mg | ORAL_TABLET | Freq: Once | ORAL | Status: AC | PRN

## 2014-04-28 MED ORDER — PROPOFOL 10 MG/ML IV BOLUS
INTRAVENOUS | Status: DC | PRN
  Administered 2014-04-28: 150 mg via INTRAVENOUS
  Administered 2014-04-28: 50 mg via INTRAVENOUS

## 2014-04-28 MED ORDER — HYDROCODONE-ACETAMINOPHEN 5-325 MG PO TABS
1.0000 | ORAL_TABLET | ORAL | Status: DC | PRN
  Administered 2014-04-28 – 2014-04-29 (×6): 2 via ORAL
  Filled 2014-04-28 (×6): qty 2

## 2014-04-28 MED ORDER — HYDROCODONE-ACETAMINOPHEN 7.5-325 MG PO TABS: 1.0000 | ORAL_TABLET | Freq: Four times a day (QID) | ORAL | Status: DC | PRN

## 2014-04-28 MED ORDER — FENTANYL CITRATE 0.05 MG/ML IJ SOLN
INTRAMUSCULAR | Status: DC | PRN
  Administered 2014-04-28 (×2): 50 ug via INTRAVENOUS

## 2014-04-28 MED ORDER — IBUPROFEN 100 MG/5ML PO SUSP
400.0000 mg | Freq: Four times a day (QID) | ORAL | Status: DC | PRN
  Administered 2014-04-28: 400 mg via ORAL

## 2014-04-28 MED ORDER — PROMETHAZINE HCL 25 MG PO TABS: 25.0000 mg | ORAL_TABLET | Freq: Four times a day (QID) | ORAL | Status: DC | PRN

## 2014-04-28 MED ORDER — DEXTROSE-NACL 5-0.9 % IV SOLN
INTRAVENOUS | Status: DC
  Administered 2014-04-28: 12:00:00 via INTRAVENOUS
  Administered 2014-04-29: 1 mL via INTRAVENOUS

## 2014-04-28 MED ORDER — METHYLENE BLUE 1 % INJ SOLN
INTRAMUSCULAR | Status: DC | PRN
  Administered 2014-04-28: .1 mL

## 2014-04-28 MED ORDER — PROMETHAZINE HCL 25 MG RE SUPP: 25.0000 mg | Freq: Four times a day (QID) | RECTAL | Status: DC | PRN

## 2014-04-28 MED ORDER — FENTANYL CITRATE 0.05 MG/ML IJ SOLN
INTRAMUSCULAR | Status: AC
  Filled 2014-04-28: qty 6

## 2014-04-28 MED ORDER — PROMETHAZINE HCL 25 MG/ML IJ SOLN
6.2500 mg | INTRAMUSCULAR | Status: DC | PRN
  Administered 2014-04-28: 12.5 mg via INTRAVENOUS

## 2014-04-28 MED ORDER — SUCCINYLCHOLINE CHLORIDE 20 MG/ML IJ SOLN
INTRAMUSCULAR | Status: DC | PRN
  Administered 2014-04-28: 50 mg via INTRAVENOUS

## 2014-04-28 MED ORDER — LACTATED RINGERS IV SOLN
INTRAVENOUS | Status: DC | PRN
  Administered 2014-04-28 (×2): via INTRAVENOUS

## 2014-04-28 MED ORDER — HYDROMORPHONE HCL 1 MG/ML IJ SOLN
INTRAMUSCULAR | Status: AC
  Filled 2014-04-28: qty 1

## 2014-04-28 MED ORDER — MIDAZOLAM HCL 2 MG/2ML IJ SOLN
INTRAMUSCULAR | Status: AC
  Filled 2014-04-28: qty 2

## 2014-04-28 MED ORDER — BACITRACIN ZINC 500 UNIT/GM EX OINT
TOPICAL_OINTMENT | CUTANEOUS | Status: DC | PRN
  Administered 2014-04-28: 1 via TOPICAL

## 2014-04-28 MED ORDER — MIDAZOLAM HCL 5 MG/5ML IJ SOLN
INTRAMUSCULAR | Status: DC | PRN
  Administered 2014-04-28: 2 mg via INTRAVENOUS

## 2014-04-28 MED ORDER — BACITRACIN ZINC 500 UNIT/GM EX OINT
TOPICAL_OINTMENT | CUTANEOUS | Status: AC
  Filled 2014-04-28: qty 3.6

## 2014-04-28 MED ORDER — LIDOCAINE-EPINEPHRINE 1 %-1:100000 IJ SOLN
INTRAMUSCULAR | Status: AC
  Filled 2014-04-28: qty 1

## 2014-04-28 MED ORDER — DIAZEPAM 5 MG PO TABS
5.0000 mg | ORAL_TABLET | Freq: Four times a day (QID) | ORAL | Status: DC | PRN
  Administered 2014-04-28: 5 mg via ORAL
  Filled 2014-04-28 (×2): qty 1

## 2014-04-28 MED ORDER — PROMETHAZINE HCL 25 MG/ML IJ SOLN
INTRAMUSCULAR | Status: AC
  Filled 2014-04-28: qty 1

## 2014-04-28 MED ORDER — DEXAMETHASONE SODIUM PHOSPHATE 4 MG/ML IJ SOLN
INTRAMUSCULAR | Status: DC | PRN
  Administered 2014-04-28: 10 mg via INTRAVENOUS

## 2014-04-28 MED ORDER — HYDROMORPHONE HCL 1 MG/ML IJ SOLN
0.2500 mg | INTRAMUSCULAR | Status: DC | PRN
  Administered 2014-04-28 (×2): 0.25 mg via INTRAVENOUS

## 2014-04-28 MED ORDER — LIDOCAINE-EPINEPHRINE 1 %-1:100000 IJ SOLN
INTRAMUSCULAR | Status: DC | PRN
  Administered 2014-04-28: 1.75 mL

## 2014-04-28 MED ORDER — MIDAZOLAM HCL 2 MG/2ML IJ SOLN: 1.0000 mg | INTRAMUSCULAR | Status: DC | PRN

## 2014-04-28 MED ORDER — OXYCODONE HCL 5 MG/5ML PO SOLN: 5.0000 mg | Freq: Once | ORAL | Status: AC | PRN

## 2014-04-28 MED ORDER — CIPROFLOXACIN-DEXAMETHASONE 0.3-0.1 % OT SUSP: 3.0000 [drp] | Freq: Three times a day (TID) | OTIC | Status: DC

## 2014-04-28 MED ORDER — IBUPROFEN 100 MG/5ML PO SUSP
ORAL | Status: AC
  Filled 2014-04-28: qty 20

## 2014-04-28 MED ORDER — DIAZEPAM 5 MG PO TABS: 5.0000 mg | ORAL_TABLET | Freq: Four times a day (QID) | ORAL | Status: DC | PRN

## 2014-04-28 SURGICAL SUPPLY — 37 items
BLADE CLIPPER SURG (BLADE) IMPLANT
CANISTER SUCT 1200ML W/VALVE (MISCELLANEOUS) ×2 IMPLANT
CLEANER CAUTERY TIP 5X5 PAD (MISCELLANEOUS) IMPLANT
COTTONBALL LRG STERILE PKG (GAUZE/BANDAGES/DRESSINGS) ×2 IMPLANT
DECANTER SPIKE VIAL GLASS SM (MISCELLANEOUS) ×2 IMPLANT
DRAPE MICROSCOPE URBAN (DRAPES) ×2 IMPLANT
DROPPER MEDICINE STER 1.5ML LF (MISCELLANEOUS) IMPLANT
ELECT COATED BLADE 2.86 ST (ELECTRODE) IMPLANT
ELECT REM PT RETURN 9FT ADLT (ELECTROSURGICAL) ×2
ELECTRODE REM PT RTRN 9FT ADLT (ELECTROSURGICAL) ×1 IMPLANT
GLOVE BIO SURGEON STRL SZ 6.5 (GLOVE) ×2 IMPLANT
GLOVE ECLIPSE 7.5 STRL STRAW (GLOVE) ×2 IMPLANT
GLOVE SURG SS PI 7.0 STRL IVOR (GLOVE) ×2 IMPLANT
GOWN STRL REUS W/ TWL LRG LVL3 (GOWN DISPOSABLE) ×1 IMPLANT
GOWN STRL REUS W/ TWL XL LVL3 (GOWN DISPOSABLE) ×1 IMPLANT
GOWN STRL REUS W/TWL LRG LVL3 (GOWN DISPOSABLE) ×1
GOWN STRL REUS W/TWL XL LVL3 (GOWN DISPOSABLE) ×1
IV CATH AUTO 14GX1.75 SAFE ORG (IV SOLUTION) IMPLANT
LIQUID BAND (GAUZE/BANDAGES/DRESSINGS) IMPLANT
NDL SAFETY ECLIPSE 18X1.5 (NEEDLE) ×1 IMPLANT
NEEDLE 27GAX1X1/2 (NEEDLE) ×2 IMPLANT
NEEDLE HYPO 18GX1.5 SHARP (NEEDLE) ×1
NS IRRIG 1000ML POUR BTL (IV SOLUTION) ×2 IMPLANT
PACK BASIN DAY SURGERY FS (CUSTOM PROCEDURE TRAY) ×2 IMPLANT
PACK ENT DAY SURGERY (CUSTOM PROCEDURE TRAY) ×2 IMPLANT
PAD CLEANER CAUTERY TIP 5X5 (MISCELLANEOUS)
PENCIL FOOT CONTROL (ELECTRODE) IMPLANT
PISTON CUP LIPPY MOD .4X4.0 SS ×2 IMPLANT
SET EXT MALE ROTATING LL 32IN (MISCELLANEOUS) ×2 IMPLANT
SHEET MEDIUM DRAPE 40X70 STRL (DRAPES) IMPLANT
SLEEVE SCD COMPRESS KNEE MED (MISCELLANEOUS) ×2 IMPLANT
SPONGE SURGIFOAM ABS GEL 12-7 (HEMOSTASIS) IMPLANT
SUT CHROMIC 4 0 P 3 18 (SUTURE) ×2 IMPLANT
SUT PLAIN 5 0 P 3 18 (SUTURE) IMPLANT
TOWEL OR 17X24 6PK STRL BLUE (TOWEL DISPOSABLE) ×4 IMPLANT
TOWEL OR NON WOVEN STRL DISP B (DISPOSABLE) ×2 IMPLANT
TRAY DSU PREP LF (CUSTOM PROCEDURE TRAY) ×2 IMPLANT

## 2014-04-28 NOTE — Anesthesia Preprocedure Evaluation (Signed)
Anesthesia Evaluation  Patient identified by MRN, date of birth, ID band Patient awake    Reviewed: Allergy & Precautions, H&P , NPO status , Patient's Chart, lab work & pertinent test results  History of Anesthesia Complications Negative for: history of anesthetic complications  Airway Mallampati: I       Dental   Pulmonary neg pulmonary ROS,  breath sounds clear to auscultation        Cardiovascular negative cardio ROS  Rhythm:Regular Rate:Normal     Neuro/Psych negative neurological ROS     GI/Hepatic negative GI ROS, Neg liver ROS,   Endo/Other  negative endocrine ROS  Renal/GU negative Renal ROS     Musculoskeletal negative musculoskeletal ROS (+)   Abdominal   Peds  Hematology negative hematology ROS (+)   Anesthesia Other Findings   Reproductive/Obstetrics                             Anesthesia Physical Anesthesia Plan  ASA: I  Anesthesia Plan: General   Post-op Pain Management:    Induction: Intravenous  Airway Management Planned: Oral ETT  Additional Equipment:   Intra-op Plan:   Post-operative Plan: Extubation in OR  Informed Consent: I have reviewed the patients History and Physical, chart, labs and discussed the procedure including the risks, benefits and alternatives for the proposed anesthesia with the patient or authorized representative who has indicated his/her understanding and acceptance.     Plan Discussed with: CRNA and Surgeon  Anesthesia Plan Comments:         Anesthesia Quick Evaluation

## 2014-04-28 NOTE — Interval H&P Note (Signed)
History and Physical Interval Note:  04/28/2014 7:32 AM  Terri Simpson  has presented today for surgery, with the diagnosis of otosclerosis  The various methods of treatment have been discussed with the patient and family. After consideration of risks, benefits and other options for treatment, the patient has consented to  Procedure(s): RIGHT STAPEDECTOMY (Right) as a surgical intervention .  The patient's history has been reviewed, patient examined, no change in status, stable for surgery.  I have reviewed the patient's chart and labs.  Questions were answered to the patient's satisfaction.     Sarahjane Matherly

## 2014-04-28 NOTE — Progress Notes (Signed)
Patient ID: Terri Simpson, female   DOB: [birth year], 28 y.o.   MRN: [id number]  Complains of minor pain, not dizzy.  Ear healthy. Tuning fork lateralizes to the right side.  Stable, cont overnight obs.

## 2014-04-28 NOTE — Anesthesia Postprocedure Evaluation (Signed)
  Anesthesia Post-op Note  Patient: Terri Simpson  Procedure(s) Performed: Procedure(s): RIGHT STAPEDECTOMY (Right)  Patient Location: PACU  Anesthesia Type:General  Level of Consciousness: awake and alert   Airway and Oxygen Therapy: Patient Spontanous Breathing  Post-op Pain: mild  Post-op Assessment: Post-op Vital signs reviewed  Post-op Vital Signs: stable  Last Vitals:  Filed Vitals:   04/28/14 1015  BP: 121/77  Pulse: 107  Temp:   Resp: 12    Complications: No apparent anesthesia complications

## 2014-04-28 NOTE — Op Note (Signed)
OPERATIVE REPORT  DATE OF SURGERY: 04/28/2014  PATIENT:  Terri Simpson,  28 y.o. female  PRE-OPERATIVE DIAGNOSIS:  otosclerosis  POST-OPERATIVE DIAGNOSIS:  otosclerosis  PROCEDURE:  Procedure(s): RIGHT STAPEDECTOMY  SURGEON:  Susy FrizzleJefry H Terren Haberle, MD  ASSISTANTS: none  ANESTHESIA:   General   EBL:  3 ml  DRAINS: none  LOCAL MEDICATIONS USED:  1% xylocaine with epinephrine  SPECIMEN:  Right stapes  COUNTS:  Correct  PROCEDURE DETAILS: The patient was taken to the operating room and placed on the operating table in the supine position. Following induction of general endotracheal anesthesia, the right ear was prepped and draped in a standard fashion. 1% Xylocaine with epinephrine was infiltrated into 4 quadrants of the external auditory canal. A round knife was used to create a posteriorly based tympanomeatal flap. The middle ear was entered. The chorda tympani nerve was riding high and was preserved. The ossicular chain was inspected. The stapes was fixated. The incus and malleus were freely mobile. The stapes was also covered in fibrotic tissue. A tragal perichondrial graft was harvested through a separate incision. The incision was reapproximated with running 5-0 plain gut. Topical adrenaline was used on cotton balls for hemostasis and the middle ear. A Rhyleigh Grassel needle was used to remove the fibrotic tissue from around the stapes. Roll was created in the footplate with a sharp pick. The incostapedial joint was separated. The stapedial tendon was cut. The superstructure was down fractured toward the promontory and removed. A right angle cost pick was used to remove the posterior two thirds of the footplate. There was no perilymphatic gusher. The graft was placed over the oval window. A 4 mm length Lippy modified bucket-handle prosthesis was then placed into position and secured in place. There appeared to be nice ossicular mobility. The anterior tympanic cavity was packed with saline  soaked Gelfoam. The tympanomeatal flap was brought back to its native position and secured in place with Ciprodex-soaked Gelfoam in the ear canal. A cotton ball with bacitracin was placed at the external meatus. The patient was then awakened, extubated and transferred to recovery in stable condition.    PATIENT DISPOSITION:  To PACU, stable

## 2014-04-28 NOTE — Anesthesia Procedure Notes (Signed)
Procedure Name: Intubation Date/Time: 04/28/2014 7:48 AM Performed by: Yorkshire DesanctisLINKA, Ida Milbrath L Pre-anesthesia Checklist: Patient identified, Emergency Drugs available, Suction available, Patient being monitored and Timeout performed Patient Re-evaluated:Patient Re-evaluated prior to inductionOxygen Delivery Method: Circle System Utilized Preoxygenation: Pre-oxygenation with 100% oxygen Intubation Type: IV induction Ventilation: Mask ventilation without difficulty Laryngoscope Size: Miller and 3 Grade View: Grade I Tube type: Oral Tube size: 7.0 mm Number of attempts: 1 Airway Equipment and Method: stylet and oral airway Placement Confirmation: ETT inserted through vocal cords under direct vision,  positive ETCO2 and breath sounds checked- equal and bilateral Tube secured with: Tape Dental Injury: Teeth and Oropharynx as per pre-operative assessment

## 2014-04-28 NOTE — H&P (View-Only) (Signed)
  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 Reason For Visit  Follow up from stapedectomy. Discussed  Doing great, very pleased with the results. The left tympanic membrane looks excellent. Audiogram revealed significant improvement in the conductive hearing loss. Residual conductive loss on the right. She would like to have the right side done before her Medicaid runs out in December. I think this is reasonable. We will schedule. Allergies  No Known Drug Allergies. Current Meds  Naproxen 500 MG Oral Tablet;; RPT Diazepam 5 MG Oral Tablet;take one tablet as needed for severe dizziness, up to 3 times daily.; Rx. 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 Signature  Electronically signed by : Avrohom Mckelvin  M.D.; 03/07/2014 3:43 PM EST.  

## 2014-04-28 NOTE — Transfer of Care (Signed)
Immediate Anesthesia Transfer of Care Note  Patient: Terri Simpson  Procedure(s) Performed: Procedure(s): RIGHT STAPEDECTOMY (Right)  Patient Location: PACU  Anesthesia Type:General  Level of Consciousness: awake, sedated and patient cooperative  Airway & Oxygen Therapy: Patient Spontanous Breathing and Patient connected to face mask oxygen  Post-op Assessment: Report given to PACU RN and Post -op Vital signs reviewed and stable  Post vital signs: Reviewed and stable  Complications: No apparent anesthesia complications

## 2014-04-28 NOTE — Discharge Instructions (Signed)
Do not blow your nose, do not lift anything greater than 10 pounds, do not bend over. If you have to sneeze, open her mouth. Keep everything out of the ear except for the drops.  Use the ear drops 3 times each day, replace a fresh cotton ball after you put the drops in.

## 2014-04-29 ENCOUNTER — Encounter (HOSPITAL_BASED_OUTPATIENT_CLINIC_OR_DEPARTMENT_OTHER): Payer: Self-pay | Admitting: Otolaryngology

## 2014-04-29 DIAGNOSIS — H8091 Unspecified otosclerosis, right ear: Secondary | ICD-10-CM | POA: Diagnosis not present

## 2014-11-03 ENCOUNTER — Ambulatory Visit: Payer: Self-pay | Attending: Internal Medicine

## 2015-03-09 ENCOUNTER — Ambulatory Visit (INDEPENDENT_AMBULATORY_CARE_PROVIDER_SITE_OTHER): Payer: Self-pay | Admitting: *Deleted

## 2015-03-09 ENCOUNTER — Encounter: Payer: Self-pay | Admitting: *Deleted

## 2015-03-09 DIAGNOSIS — Z3201 Encounter for pregnancy test, result positive: Secondary | ICD-10-CM

## 2015-03-09 DIAGNOSIS — N926 Irregular menstruation, unspecified: Secondary | ICD-10-CM

## 2015-03-09 LAB — POCT PREGNANCY, URINE: PREG TEST UR: POSITIVE — AB

## 2015-03-09 NOTE — Progress Notes (Signed)
Pt lmp 01/31/15, EDD 11/07/15.  Pregnancy letter verification given.

## 2015-03-12 ENCOUNTER — Inpatient Hospital Stay (HOSPITAL_COMMUNITY)
Admission: AD | Admit: 2015-03-12 | Discharge: 2015-03-12 | Disposition: A | Discharge status: Home / Self Care | Payer: Medicaid Other | Source: Ambulatory Visit | Attending: Obstetrics & Gynecology | Admitting: Obstetrics & Gynecology

## 2015-03-12 ENCOUNTER — Encounter (HOSPITAL_COMMUNITY): Payer: Self-pay | Admitting: *Deleted

## 2015-03-12 ENCOUNTER — Inpatient Hospital Stay (HOSPITAL_COMMUNITY): Payer: Medicaid Other

## 2015-03-12 DIAGNOSIS — O209 Hemorrhage in early pregnancy, unspecified: Secondary | ICD-10-CM | POA: Insufficient documentation

## 2015-03-12 DIAGNOSIS — O4691 Antepartum hemorrhage, unspecified, first trimester: Secondary | ICD-10-CM

## 2015-03-12 DIAGNOSIS — N939 Abnormal uterine and vaginal bleeding, unspecified: Secondary | ICD-10-CM | POA: Diagnosis present

## 2015-03-12 DIAGNOSIS — Z3A01 Less than 8 weeks gestation of pregnancy: Secondary | ICD-10-CM | POA: Diagnosis not present

## 2015-03-12 LAB — CBC
HEMATOCRIT: 41.5 % (ref 36.0–46.0)
HEMOGLOBIN: 14 g/dL (ref 12.0–15.0)
MCH: 27.8 pg (ref 26.0–34.0)
MCHC: 33.7 g/dL (ref 30.0–36.0)
MCV: 82.5 fL (ref 78.0–100.0)
Platelets: 204 10*3/uL (ref 150–400)
RBC: 5.03 MIL/uL (ref 3.87–5.11)
RDW: 14.1 % (ref 11.5–15.5)
WBC: 10.8 10*3/uL — ABNORMAL HIGH (ref 4.0–10.5)

## 2015-03-12 LAB — WET PREP, GENITAL
TRICH WET PREP: NONE SEEN
Yeast Wet Prep HPF POC: NONE SEEN

## 2015-03-12 LAB — ABO/RH: ABO/RH(D): O POS

## 2015-03-12 LAB — HCG, QUANTITATIVE, PREGNANCY: hCG, Beta Chain, Quant, S: 8 m[IU]/mL — ABNORMAL HIGH (ref <5)

## 2015-03-12 NOTE — Discharge Instructions (Signed)

## 2015-03-12 NOTE — MAU Provider Note (Signed)
Chief Complaint: No chief complaint on file.   First Provider Initiated Contact with Patient 03/12/15 1837     SUBJECTIVE HPI: Terri Simpson is a 29 y.o. G1P0 at [redacted]w[redacted]d by LMP who presents to Maternity Admissions reporting small amount of vaginal bleeding since this afternoon. Reported cramping to RN, but denies to CNM.  Was seen in maternity admissions 03/09/2015 for pregnancy verification. UPT was positive. No pain or bleeding at that time. No blood work or ultrasounds this pregnancy.  Duration: Less than 4 hours Context: None Associated signs and symptoms: Negative for abdominal pain, vaginal discharge, passage of clots or tissue.  Past Medical History  Diagnosis Date  . 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 Lesion of skin of face 04/21/2014    left cheek   OB History  Gravida Para Term Preterm AB SAB TAB Ectopic Multiple Living  1             # Outcome Date GA Lbr Len/2nd Weight Sex Delivery Anes PTL Lv  1 Current              Past Surgical History  Procedure Laterality Date  . Stapedectomy Left   . Stapedectomy Right 04/28/2014    Procedure: RIGHT STAPEDECTOMY;  Surgeon: Serena Colonel, MD;  Location: [facility];  Service: ENT;  Laterality: Right;   Social History   Social History  . Marital Status: Married    Spouse Name: N/A  . Number of Children: N/A  . Years of Education: N/A   Occupational History  . Not on file.   Social History Main Topics  . Smoking status: Never Smoker   . Smokeless tobacco: Never Used  . Alcohol Use: No  . Drug Use: No  . Sexual Activity: Not on file   Other Topics Concern  . Not on file   Social History Narrative   No current facility-administered medications on file prior to encounter.   Current Outpatient Prescriptions on File Prior to Encounter  Medication Sig Dispense Refill  . HYDROcodone-acetaminophen (NORCO) 7.5-325 MG per tablet Take 1 tablet by mouth every 6 (six) hours as  needed for moderate pain. 30 tablet 0  . ciprofloxacin-dexamethasone (CIPRODEX) otic suspension Place 3 drops into the left ear 3 (three) times daily. (Patient not taking: Reported on 03/12/2015) 7.5 mL 2  . diazepam (VALIUM) 5 MG tablet Take 1 tablet (5 mg total) by mouth every 6 (six) hours as needed (Dizziness). (Patient not taking: Reported on 03/12/2015) 30 tablet 0  . promethazine (PHENERGAN) 25 MG suppository Place 1 suppository (25 mg total) rectally every 6 (six) hours as needed for nausea or vomiting. (Patient not taking: Reported on 03/12/2015) 12 suppository 1   No Known Allergies  I have reviewed the past Medical Hx, Surgical Hx, Social Hx, Allergies and Medications.   Review of Systems  Constitutional: Negative for fever and chills.  Gastrointestinal: Positive for nausea. Negative for vomiting, abdominal pain, diarrhea, constipation, blood in stool and anal bleeding.  Genitourinary: Positive for vaginal bleeding. Negative for hematuria, vaginal discharge, vaginal pain and pelvic pain.  Musculoskeletal: Negative for back pain.    OBJECTIVE Patient Vitals for the past 24 hrs:  BP Temp Temp src Pulse Resp Height Weight  03/12/15 1815 137/90 mmHg 98.3 F (36.8 C) Oral (!) 123 18 5' (1.524 m) 193 lb 9.6 oz (87.816 kg)   Constitutional: Well-developed, well-nourished female in no acute distress. Anxious. Cardiovascular: Tachycardia. Respiratory: normal rate and effort.  GI: Abd soft, non-tender. Fundus nonpalpable. MS: Extremities nontender, no edema, normal ROM Neurologic: Alert and oriented x 4.  GU: Neg CVAT.  SPECULUM EXAM: NEFG, small-moderate amount of right red blood mixed with mucus. cervix clean  BIMANUAL: cervix closed; uterus normal size, no adnexal tenderness or masses. No CMT.  LAB RESULTS Results for orders placed or performed during the hospital encounter of 03/12/15 (from the past 24 hour(s))  hCG, quantitative, pregnancy     Status: Abnormal   Collection  Time: 03/12/15  6:50 PM  Result Value Ref Range   hCG, Beta Chain, Quant, S 8 (H) <5 mIU/mL  ABO/Rh     Status: None (Preliminary result)   Collection Time: 03/12/15  6:50 PM  Result Value Ref Range   ABO/RH(D) O POS   CBC     Status: Abnormal   Collection Time: 03/12/15  6:50 PM  Result Value Ref Range   WBC 10.8 (H) 4.0 - 10.5 K/uL   RBC 5.03 3.87 - 5.11 MIL/uL   Hemoglobin 14.0 12.0 - 15.0 g/dL   HCT 16.141.5 09.636.0 - 04.546.0 %   MCV 82.5 78.0 - 100.0 fL   MCH 27.8 26.0 - 34.0 pg   MCHC 33.7 30.0 - 36.0 g/dL   RDW 40.914.1 81.111.5 - 91.415.5 %   Platelets 204 150 - 400 K/uL    IMAGING [place] Ob Comp Less 14 Wks  03/12/2015  CLINICAL DATA:  The patient is pregnant with vaginal bleeding. EXAM: OBSTETRIC <14 WK ULTRASOUND; TRANSVAGINAL OB ULTRASOUND TECHNIQUE: Transvaginal ultrasound was performed for complete evaluation of the gestation as well as the maternal uterus, adnexal regions, and pelvic cul-de-sac. COMPARISON:  None. FINDINGS: Intrauterine gestational sac: Not visualized Yolk sac:  Not present Embryo:  Not present Cardiac Activity: Not present Heart Rate: Not present bpm MSD:   mm    w     d CRL:     mm    w  d                  US EDC: Maternal uterus/adnexae: The bilateral ovaries are normal. The ultrasound technologist reports swirling mixed echotexture material within the endometrium, this could represent blood. IMPRESSION: No intrauterine gestational sac identified. The ultrasound technologist reports swirling mixed echotexture material within the endometrium, this could represent blood. Electronically Signed   By: Sherian ReinWei-Chen  Lin M.D.   On: 03/12/2015 19:59   [place] Ob Transvaginal  03/12/2015  CLINICAL DATA:  The patient is pregnant with vaginal bleeding. EXAM: OBSTETRIC <14 WK ULTRASOUND; TRANSVAGINAL OB ULTRASOUND TECHNIQUE: Transvaginal ultrasound was performed for complete evaluation of the gestation as well as the maternal uterus, adnexal regions, and pelvic cul-de-sac. COMPARISON:  None.  FINDINGS: Intrauterine gestational sac: Not visualized Yolk sac:  Not present Embryo:  Not present Cardiac Activity: Not present Heart Rate: Not present bpm MSD:   mm    w     d CRL:     mm    w  d                  US EDC: Maternal uterus/adnexae: The bilateral ovaries are normal. The ultrasound technologist reports swirling mixed echotexture material within the endometrium, this could represent blood. IMPRESSION: No intrauterine gestational sac identified. The ultrasound technologist reports swirling mixed echotexture material within the endometrium, this could represent blood. Electronically Signed   By: Sherian ReinWei-Chen  Lin M.D.   On: 03/12/2015 19:59    MAU COURSE Quant, ultrasound, CBC, wet prep, GC/chlamydia cultures.  Explained to patient that location of pregnancy cannot be determined and hCG level is very low.   MDM 29 year old female at 5 weeks 5 days by certain LMP, but Sharene Butters is only 8 and location of pregnancy cannot be determined on ultrasound. Highly concerned for failed pregnancy. Maternal tachycardia noted but low concern for ruptured ectopic pregnancy considering no free fluid on ultrasound, hemoglobin 14, absence of pain and Quant of only 8. Patient stable for discharge at this time.   ASSESSMENT 1. Vaginal bleeding in pregnancy, first trimester    PLAN Discharge home in stable condition. Ectopic and SAB Precautions Follow-up Information    Follow up with [facility] [place] [facility] In 2 days.   Why:  For repeat blood work or as needed for severe bleeding, severe pain or fever greater than 100.4.   Contact information:   [address] [id number] mc [place] [address] [phone]       Medication List    STOP taking these medications        ciprofloxacin-dexamethasone otic suspension  Commonly known as:  CIPRODEX     diazepam 5 MG tablet  Commonly known as:  VALIUM     HYDROcodone-acetaminophen 7.5-325 MG tablet   Commonly known as:  NORCO     promethazine 25 MG suppository  Commonly known as:  PHENERGAN      TAKE these medications        multivitamin with minerals Tabs tablet  Take 2 tablets by mouth daily.         [place], CNM 03/12/2015  8:42 PM

## 2015-03-12 NOTE — MAU Note (Signed)
Little bit of spotting, little bit of pain. Pain started this morning, bleeding started 2 hours ago.

## 2015-03-12 NOTE — MAU Note (Signed)
Urine in lab 

## 2015-03-13 LAB — GC/CHLAMYDIA PROBE AMP (~~LOC~~) NOT AT ARMC
Chlamydia: NEGATIVE
Neisseria Gonorrhea: NEGATIVE

## 2015-03-13 LAB — HIV ANTIBODY (ROUTINE TESTING W REFLEX): HIV SCREEN 4TH GENERATION: NONREACTIVE

## 2015-03-23 ENCOUNTER — Inpatient Hospital Stay (HOSPITAL_COMMUNITY)
Admission: AD | Admit: 2015-03-23 | Discharge: 2015-03-23 | Disposition: A | Discharge status: Home / Self Care | Payer: Medicaid Other | Source: Ambulatory Visit | Attending: Obstetrics and Gynecology | Admitting: Obstetrics and Gynecology

## 2015-03-23 ENCOUNTER — Encounter (HOSPITAL_COMMUNITY): Payer: Self-pay | Admitting: *Deleted

## 2015-03-23 DIAGNOSIS — S161XXA Strain of muscle, fascia and tendon at neck level, initial encounter: Secondary | ICD-10-CM | POA: Diagnosis not present

## 2015-03-23 DIAGNOSIS — M549 Dorsalgia, unspecified: Secondary | ICD-10-CM | POA: Diagnosis present

## 2015-03-23 DIAGNOSIS — O039 Complete or unspecified spontaneous abortion without complication: Secondary | ICD-10-CM | POA: Insufficient documentation

## 2015-03-23 LAB — URINALYSIS, ROUTINE W REFLEX MICROSCOPIC
Bilirubin Urine: NEGATIVE
Glucose, UA: NEGATIVE mg/dL
KETONES UR: NEGATIVE mg/dL
LEUKOCYTES UA: NEGATIVE
Nitrite: NEGATIVE
PROTEIN: NEGATIVE mg/dL
Specific Gravity, Urine: >1.03 — ABNORMAL HIGH (ref 1.005–1.030)
UROBILINOGEN UA: 0.2 mg/dL (ref 0.0–1.0)
pH: 5.5 (ref 5.0–8.0)

## 2015-03-23 LAB — URINE MICROSCOPIC-ADD ON

## 2015-03-23 LAB — HCG, QUANTITATIVE, PREGNANCY

## 2015-03-23 MED ORDER — IBUPROFEN 600 MG PO TABS
600.0000 mg | ORAL_TABLET | Freq: Once | ORAL | Status: AC
  Administered 2015-03-23: 600 mg via ORAL
  Filled 2015-03-23: qty 1

## 2015-03-23 MED ORDER — CYCLOBENZAPRINE HCL 5 MG PO TABS
5.0000 mg | ORAL_TABLET | Freq: Once | ORAL | Status: AC
  Administered 2015-03-23: 5 mg via ORAL
  Filled 2015-03-23: qty 1

## 2015-03-23 NOTE — MAU Provider Note (Signed)
History     CSN: [id number]  Arrival date and time: 03/23/15 1100   First Provider Initiated Contact with Patient 03/23/15 1157      Chief Complaint  Patient presents with  . Repeat Labwork   . Back Pain   HPI   Ms.Terri Simpson is a 29 y.o. female G1P0 at 4639w2d presenting to MAU for repeat blood work. She was seen last week for vaginal bleeding and had a pregnancy hormone level of 8; she was instructed to come back however was not able to make it.  She currently denies abdominal pain and vaginal bleeding. Her bleeding stopped sometime last week.   She woke up this morning with a stiff neck; she is having a hard time moving her neck to the right.  She currently rates the pain 8/10; the pain worsens when she turns her head to the right side. She has not taken anything for the pain. She is able to move her head normally, however it is very sore.   OB History    Gravida Para Term Preterm AB TAB SAB Ectopic Multiple Living   1               Past Medical History  Diagnosis Date  . 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 Lesion of skin of face 04/21/2014    left cheek    Past Surgical History  Procedure Laterality Date  . Stapedectomy Left   . Stapedectomy Right 04/28/2014    Procedure: RIGHT STAPEDECTOMY;  Surgeon: Serena ColonelJefry Rosen, MD;  Location: [facility];  Service: ENT;  Laterality: Right;    History reviewed. No pertinent family history.  Social History  Substance Use Topics  . Smoking status: Never Smoker   . Smokeless tobacco: Never Used  . Alcohol Use: No    Allergies: No Known Allergies  Prescriptions prior to admission  Medication Sig Dispense Refill Last Dose  . Multiple Vitamin (MULTIVITAMIN WITH MINERALS) TABS tablet Take 2 tablets by mouth daily.   03/22/2015 at Unknown time   Results for orders placed or performed during the hospital encounter of 03/23/15 (from the past 48 hour(s))  Urinalysis, Routine w reflex  microscopic (not at [facility])     Status: Abnormal   Collection Time: 03/23/15 11:20 AM  Result Value Ref Range   Color, Urine YELLOW YELLOW   APPearance CLOUDY (A) CLEAR   Specific Gravity, Urine >1.030 (H) 1.005 - 1.030   pH 5.5 5.0 - 8.0   Glucose, UA NEGATIVE NEGATIVE mg/dL   Hgb urine dipstick TRACE (A) NEGATIVE   Bilirubin Urine NEGATIVE NEGATIVE   Ketones, ur NEGATIVE NEGATIVE mg/dL   Protein, ur NEGATIVE NEGATIVE mg/dL   Urobilinogen, UA 0.2 0.0 - 1.0 mg/dL   Nitrite NEGATIVE NEGATIVE   Leukocytes, UA NEGATIVE NEGATIVE  Urine microscopic-add on     Status: Abnormal   Collection Time: 03/23/15 11:20 AM  Result Value Ref Range   Squamous Epithelial / LPF FEW (A) RARE   Urine-Other AMORPHOUS URATES/PHOSPHATES   hCG, quantitative, pregnancy     Status: None   Collection Time: 03/23/15 11:41 AM  Result Value Ref Range   hCG, Beta Chain, Quant, S <1 <5 mIU/mL    Comment:          GEST. AGE      CONC.  (mIU/mL)   <=1 WEEK        5 - 50     2 WEEKS  50 - 500     3 WEEKS       100 - 10,000     4 WEEKS     1,000 - 30,000     5 WEEKS     3,500 - 115,000   6-8 WEEKS     12,000 - 270,000    12 WEEKS     15,000 - 220,000        FEMALE AND NON-PREGNANT FEMALE:     LESS THAN 5 mIU/mL REPEATED TO VERIFY    Review of Systems  Constitutional: Negative for fever and chills.  Gastrointestinal: Negative for abdominal pain.  Genitourinary: Negative for dysuria.  Musculoskeletal: Positive for joint pain (Patient feels she slept on her neck wrong. ). Negative for back pain.   Physical Exam   Blood pressure 100/66, pulse 72, temperature 97.7 F (36.5 C), temperature source Oral, resp. rate 16, height  (1.549 m), weight 192 lb 2 oz (87.147 kg), last menstrual period 01/31/2015.  Physical Exam  Constitutional: She is oriented to person, place, and time. She appears well-developed and well-nourished. No distress.  Eyes: Pupils are equal, round, and reactive to light.  Neck:  Trachea normal and normal range of motion. Neck supple. No JVD present. Muscular tenderness present. No spinous process tenderness present. No rigidity.    Respiratory: Effort normal.  Neurological: She is oriented to person, place, and time.  Skin: Skin is warm. She is not diaphoretic.  Psychiatric: Her behavior is normal.    MAU Course  Procedures  None  MDM  Flexeril 5 mg Ibuprofen 600 mg  Heat applied   The patient left before I could reexamine her neck and see if the medication has helped.   Assessment and Plan    A:  1. SAB (spontaneous abortion)   2. Strain of neck muscle, initial encounter    P:  Discharge home in stable condition Follow up with the [facility] as needed Alternate ice and heat to affected area; go to [facility] or [facility] ED if symptoms persist or worsen Support given.  Duane Lope, NP 03/23/2015 12:06 PM

## 2015-03-23 NOTE — MAU Note (Addendum)
Patient presents at [redacted] weeks gestation for repeat labwork but c/o right neck and upper to mid back pain since awakening this morning. Unable to move head to right side. Denies bleeding or discharge.

## 2015-03-23 NOTE — Discharge Instructions (Signed)
Acute Torticollis °Torticollis is a condition in which the muscles of the neck tighten (contract) abnormally, causing the neck to twist and the head to move into an unnatural position. Torticollis that develops suddenly is called acute torticollis. If torticollis becomes chronic and is left untreated, the face and neck can become deformed. °CAUSES °This condition may be caused by: °· Sleeping in an awkward position (common). °· Extending or twisting the neck muscles beyond their normal position. °· Infection. °In some cases, the cause may not be known. °SYMPTOMS °Symptoms of this condition include: °· An unnatural position of the head. °· Neck pain. °· A limited ability to move the neck. °· Twisting of the neck to one side. °DIAGNOSIS °This condition is diagnosed with a physical exam. You may also have imaging tests, such as an X-ray, CT scan, or MRI. °TREATMENT °Treatment for this condition involves trying to relax the neck muscles. It may include: °· Medicines or shots. °· Physical therapy. °· Surgery. This may be done in severe cases. °HOME CARE INSTRUCTIONS °· Take medicines only as directed by your health care provider. °· Do stretching exercises and massage your neck as directed by your health care provider. °· Keep all follow-up visits as directed by your health care provider. This is important. °SEEK MEDICAL CARE IF: °· You develop a fever. °SEEK IMMEDIATE MEDICAL CARE IF: °· You develop difficulty breathing. °· You develop noisy breathing (stridor). °· You start drooling. °· You have trouble swallowing or have pain with swallowing. °· You develop numbness or weakness in your hands or feet. °· You have changes in your speech, understanding, or vision. °· Your pain gets worse. °  °This information is not intended to replace advice given to you by your health care provider. Make sure you discuss any questions you have with your health care provider. °  °Document Released: 05/13/2000 Document Revised:  09/30/2014 Document Reviewed: 05/12/2014 °Elsevier Interactive Patient Education ©2016 [employer] ° °

## 2015-03-23 NOTE — MAU Note (Signed)
Urine in lab 

## 2015-04-30 ENCOUNTER — Encounter: Payer: Medicaid Other | Admitting: Family Medicine

## 2015-11-09 ENCOUNTER — Encounter (HOSPITAL_COMMUNITY): Payer: Self-pay | Admitting: *Deleted

## 2015-11-09 ENCOUNTER — Emergency Department (HOSPITAL_COMMUNITY): Payer: Medicaid Other

## 2015-11-09 ENCOUNTER — Emergency Department (HOSPITAL_COMMUNITY)
Admission: EM | Admit: 2015-11-09 | Discharge: 2015-11-09 | Disposition: A | Discharge status: Home / Self Care | Payer: Medicaid Other | Attending: Emergency Medicine | Admitting: Emergency Medicine

## 2015-11-09 DIAGNOSIS — N83201 Unspecified ovarian cyst, right side: Secondary | ICD-10-CM | POA: Diagnosis not present

## 2015-11-09 DIAGNOSIS — N83209 Unspecified ovarian cyst, unspecified side: Secondary | ICD-10-CM

## 2015-11-09 DIAGNOSIS — R102 Pelvic and perineal pain: Secondary | ICD-10-CM

## 2015-11-09 DIAGNOSIS — R1030 Lower abdominal pain, unspecified: Secondary | ICD-10-CM | POA: Diagnosis present

## 2015-11-09 LAB — LIPASE, BLOOD: LIPASE: 17 U/L (ref 11–51)

## 2015-11-09 LAB — CBC
HEMATOCRIT: 42.7 % (ref 36.0–46.0)
Hemoglobin: 14.3 g/dL (ref 12.0–15.0)
MCH: 27.3 pg (ref 26.0–34.0)
MCHC: 33.5 g/dL (ref 30.0–36.0)
MCV: 81.6 fL (ref 78.0–100.0)
PLATELETS: 229 10*3/uL (ref 150–400)
RBC: 5.23 MIL/uL — ABNORMAL HIGH (ref 3.87–5.11)
RDW: 14.3 % (ref 11.5–15.5)
WBC: 10 10*3/uL (ref 4.0–10.5)

## 2015-11-09 LAB — COMPREHENSIVE METABOLIC PANEL
ALBUMIN: 4.5 g/dL (ref 3.5–5.0)
ALT: 26 U/L (ref 14–54)
AST: 23 U/L (ref 15–41)
Alkaline Phosphatase: 73 U/L (ref 38–126)
Anion gap: 10 (ref 5–15)
BUN: 14 mg/dL (ref 6–20)
CHLORIDE: 106 mmol/L (ref 101–111)
CO2: 20 mmol/L — AB (ref 22–32)
CREATININE: 0.5 mg/dL (ref 0.44–1.00)
Calcium: 9.3 mg/dL (ref 8.9–10.3)
GFR calc Af Amer: >60 mL/min (ref >60)
GLUCOSE: 156 mg/dL — AB (ref 65–99)
POTASSIUM: 3.9 mmol/L (ref 3.5–5.1)
Sodium: 136 mmol/L (ref 135–145)
Total Bilirubin: 0.6 mg/dL (ref 0.3–1.2)
Total Protein: 8.2 g/dL — ABNORMAL HIGH (ref 6.5–8.1)

## 2015-11-09 LAB — WET PREP, GENITAL
CLUE CELLS WET PREP: NONE SEEN
Sperm: NONE SEEN
Trich, Wet Prep: NONE SEEN
Yeast Wet Prep HPF POC: NONE SEEN

## 2015-11-09 LAB — PREGNANCY, URINE: PREG TEST UR: NEGATIVE

## 2015-11-09 LAB — URINALYSIS, ROUTINE W REFLEX MICROSCOPIC
Bilirubin Urine: NEGATIVE
GLUCOSE, UA: NEGATIVE mg/dL
HGB URINE DIPSTICK: NEGATIVE
KETONES UR: NEGATIVE mg/dL
LEUKOCYTES UA: NEGATIVE
Nitrite: NEGATIVE
PH: 5.5 (ref 5.0–8.0)
PROTEIN: NEGATIVE mg/dL
Specific Gravity, Urine: 1.034 — ABNORMAL HIGH (ref 1.005–1.030)

## 2015-11-09 MED ORDER — HYDROMORPHONE HCL 1 MG/ML IJ SOLN
1.0000 mg | Freq: Once | INTRAMUSCULAR | Status: AC
Start: 2015-11-09 — End: 2015-11-09
  Administered 2015-11-09: 1 mg via INTRAVENOUS
  Filled 2015-11-09: qty 1

## 2015-11-09 MED ORDER — LIDOCAINE HCL 1 % IJ SOLN
INTRAMUSCULAR | Status: AC
  Filled 2015-11-09: qty 80

## 2015-11-09 MED ORDER — HYDROCODONE-ACETAMINOPHEN 5-325 MG PO TABS: 1.0000 | ORAL_TABLET | Freq: Four times a day (QID) | ORAL | Status: DC | PRN

## 2015-11-09 MED ORDER — ONDANSETRON HCL 4 MG/2ML IJ SOLN
4.0000 mg | Freq: Once | INTRAMUSCULAR | Status: AC
  Administered 2015-11-09: 4 mg via INTRAVENOUS
  Filled 2015-11-09: qty 2

## 2015-11-09 MED ORDER — ONDANSETRON HCL 4 MG PO TABS: 4.0000 mg | ORAL_TABLET | Freq: Four times a day (QID) | ORAL | Status: DC

## 2015-11-09 NOTE — ED Provider Notes (Signed)
CSN: [id number]     Arrival date & time 11/09/15  1106 History   First MD Initiated Contact with Patient 11/09/15 1151     Chief Complaint  Patient presents with  . Pelvic Pain  . Back Pain     (Consider location/radiation/quality/duration/timing/severity/associated sxs/prior Treatment) HPI Comments: Patient presents today with a chief complaint of bilateral lower abdominal pain.  She reports acute onset of pain around 10:30 AM this morning after having a BM.  Pain has been constant since that time.  Pain radiates to the lower back bilaterally.  She has not taken anything for pain prior to arrival.  She states that she had similar pain in 2014 and was told that it was her "ovaries".  She does report associated nausea.  She denies fever, chills, vaginal discharge, urinary symptoms, vomiting, or diarrhea.  The history is provided by the patient. The history is limited by a language barrier. A language interpreter was used.    Past Medical History  Diagnosis Date  . 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 Lesion of skin of face 04/21/2014    left cheek   Past Surgical History  Procedure Laterality Date  . Stapedectomy Left   . Stapedectomy Right 04/28/2014    Procedure: RIGHT STAPEDECTOMY;  Surgeon: Serena ColonelJefry Rosen, MD;  Location: [facility];  Service: ENT;  Laterality: Right;   No family history on file. Social History  Substance Use Topics  . Smoking status: Never Smoker   . Smokeless tobacco: Never Used  . Alcohol Use: No   OB History    Gravida Para Term Preterm AB TAB SAB Ectopic Multiple Living   1              Review of Systems  All other systems reviewed and are negative.     Allergies  Review of patient's allergies indicates no known allergies.  Home Medications   Prior to Admission medications   Medication Sig Start Date End Date Taking? Authorizing Provider  Omega-3 Fatty Acids (FISH OIL PO) Take 1 capsule by mouth daily.   Yes  Historical Provider, MD  sulfamethoxazole-trimethoprim (BACTRIM DS,SEPTRA DS) 800-160 MG tablet Take 1 tablet by mouth 2 (two) times daily. Started 06/12 for 6 days   Yes Historical Provider, MD   BP 111/82 mmHg  Pulse 129  Temp(Src) 98 F (36.7 C) (Oral)  Resp 20  Ht 5' (1.524 m)  Wt 77.111 kg  BMI 33.20 kg/m2  SpO2 99%  LMP 10/19/2015  Breastfeeding? Unknown Physical Exam  Constitutional: She appears well-developed and well-nourished.  HENT:  Head: Normocephalic and atraumatic.  Neck: Normal range of motion. Neck supple.  Cardiovascular: Normal rate, regular rhythm and normal heart sounds.   Pulmonary/Chest: Effort normal and breath sounds normal.  Abdominal: Soft. Bowel sounds are normal. She exhibits no distension and no mass. There is tenderness. There is no rebound and no guarding.  Tenderness to palpation across lower abdomen  Genitourinary: Cervix exhibits no motion tenderness and no discharge. Right adnexum displays tenderness. Right adnexum displays no mass and no fullness. Left adnexum displays tenderness. Left adnexum displays no mass and no fullness.  Musculoskeletal: Normal range of motion.  Neurological: She is alert.  Skin: Skin is warm and dry.  Psychiatric: She has a normal mood and affect.  Nursing note and vitals reviewed.   ED Course  Procedures (including critical care time) Labs Review Labs Reviewed  CBC - Abnormal; Notable for the following:  RBC 5.23 (*)    All other components within normal limits  LIPASE, BLOOD  COMPREHENSIVE METABOLIC PANEL  URINALYSIS, ROUTINE W REFLEX MICROSCOPIC (NOT AT [facility])  POC URINE PREG, ED    Imaging Review No results found. I have personally reviewed and evaluated these images and lab results as part of my medical decision-making.   EKG Interpretation None      MDM   Final diagnoses:  None   Patient presents today with bilateral pelvic pain onset earlier this morning.  Urine pregnancy is negative.  She  has bilateral adnexal tenderness on exam.  No CMT.  Pelvic ultrasound showing a hemorrhagic ovarian cyst on the right.  Pain controlled in the ED.  Patient given referral to OB/GYN.  Discharged home with pain medication.  Stable for discharge.  Return precautions given.     Santiago Glad, PA-C 11/11/15 1610  Arby Barrette, MD 11/14/15 1455

## 2015-11-09 NOTE — ED Notes (Signed)
Pt's brother-in-law reports pt started to have severe pelvic pain which started while having a BM and low back pain.  States she is unable to sit down d/t severe pain.  Pt denies any dysuria or hematuria at this time.

## 2015-11-10 LAB — GC/CHLAMYDIA PROBE AMP (~~LOC~~) NOT AT ARMC
CHLAMYDIA, DNA PROBE: NEGATIVE
NEISSERIA GONORRHEA: NEGATIVE

## 2015-11-18 ENCOUNTER — Ambulatory Visit: Payer: Medicaid Other

## 2015-11-19 ENCOUNTER — Ambulatory Visit: Payer: Medicaid Other | Attending: Internal Medicine

## 2015-11-20 ENCOUNTER — Ambulatory Visit: Payer: Medicaid Other | Attending: Internal Medicine

## 2015-11-30 ENCOUNTER — Ambulatory Visit (INDEPENDENT_AMBULATORY_CARE_PROVIDER_SITE_OTHER): Payer: Self-pay | Admitting: Obstetrics & Gynecology

## 2015-11-30 ENCOUNTER — Encounter: Payer: Self-pay | Admitting: Obstetrics & Gynecology

## 2015-11-30 VITALS — BP 98/85 | HR 88 | Ht 60.0 in | Wt 189.0 lb

## 2015-11-30 DIAGNOSIS — N83201 Unspecified ovarian cyst, right side: Secondary | ICD-10-CM

## 2015-11-30 DIAGNOSIS — Z124 Encounter for screening for malignant neoplasm of cervix: Secondary | ICD-10-CM

## 2015-11-30 DIAGNOSIS — R102 Pelvic and perineal pain: Secondary | ICD-10-CM

## 2015-11-30 MED ORDER — NAPROXEN 500 MG PO TABS: 500.0000 mg | ORAL_TABLET | Freq: Two times a day (BID) | ORAL | Status: DC | PRN

## 2015-11-30 MED ORDER — NAPROXEN 500 MG PO TABS: 500.0000 mg | ORAL_TABLET | Freq: Two times a day (BID) | ORAL | Status: DC

## 2015-11-30 NOTE — Patient Instructions (Signed)
Ovarian Cyst An ovarian cyst is a fluid-filled sac that forms on an ovary. The ovaries are small organs that produce eggs in women. Various types of cysts can form on the ovaries. Most are not cancerous. Many do not cause problems, and they often go away on their own. Some may cause symptoms and require treatment. Common types of ovarian cysts include:  Functional cysts--These cysts may occur every month during the menstrual cycle. This is normal. The cysts usually go away with the next menstrual cycle if the woman does not get pregnant. Usually, there are no symptoms with a functional cyst.  Endometrioma cysts--These cysts form from the tissue that lines the uterus. They are also called "chocolate cysts" because they become filled with blood that turns brown. This type of cyst can cause pain in the lower abdomen during intercourse and with your menstrual period.  Cystadenoma cysts--This type develops from the cells on the outside of the ovary. These cysts can get very big and cause lower abdomen pain and pain with intercourse. This type of cyst can twist on itself, cut off its blood supply, and cause severe pain. It can also easily rupture and cause a lot of pain.  Dermoid cysts--This type of cyst is sometimes found in both ovaries. These cysts may contain different kinds of body tissue, such as skin, teeth, hair, or cartilage. They usually do not cause symptoms unless they get very big.  Theca lutein cysts--These cysts occur when too much of a certain hormone (human chorionic gonadotropin) is produced and overstimulates the ovaries to produce an egg. This is most common after procedures used to assist with the conception of a baby (in vitro fertilization). CAUSES   Fertility drugs can cause a condition in which multiple large cysts are formed on the ovaries. This is called ovarian hyperstimulation syndrome.  A condition called polycystic ovary syndrome can cause hormonal imbalances that can lead to  nonfunctional ovarian cysts. SIGNS AND SYMPTOMS  Many ovarian cysts do not cause symptoms. If symptoms are present, they may include:  Pelvic pain or pressure.  Pain in the lower abdomen.  Pain during sexual intercourse.  Increasing girth (swelling) of the abdomen.  Abnormal menstrual periods.  Increasing pain with menstrual periods.  Stopping having menstrual periods without being pregnant. DIAGNOSIS  These cysts are commonly found during a routine or annual pelvic exam. Tests may be ordered to find out more about the cyst. These tests may include:  Ultrasound.  X-ray of the pelvis.  CT scan.  MRI.  Blood tests. TREATMENT  Many ovarian cysts go away on their own without treatment. Your health care provider may want to check your cyst regularly for 2-3 months to see if it changes. For women in menopause, it is particularly important to monitor a cyst closely because of the higher rate of ovarian cancer in menopausal women. When treatment is needed, it may include any of the following:  A procedure to drain the cyst (aspiration). This may be done using a long needle and ultrasound. It can also be done through a laparoscopic procedure. This involves using a thin, lighted tube with a tiny camera on the end (laparoscope) inserted through a small incision.  Surgery to remove the whole cyst. This may be done using laparoscopic surgery or an open surgery involving a larger incision in the lower abdomen.  Hormone treatment or birth control pills. These methods are sometimes used to help dissolve a cyst. HOME CARE INSTRUCTIONS   Only take over-the-counter   or prescription medicines as directed by your health care provider.  Follow up with your health care provider as directed.  Get regular pelvic exams and Pap tests. SEEK MEDICAL CARE IF:   Your periods are late, irregular, or painful, or they stop.  Your pelvic pain or abdominal pain does not go away.  Your abdomen becomes  larger or swollen.  You have pressure on your bladder or trouble emptying your bladder completely.  You have pain during sexual intercourse.  You have feelings of fullness, pressure, or discomfort in your stomach.  You lose weight for no apparent reason.  You feel generally ill.  You become constipated.  You lose your appetite.  You develop acne.  You have an increase in body and facial hair.  You are gaining weight, without changing your exercise and eating habits.  You think you are pregnant. SEEK IMMEDIATE MEDICAL CARE IF:   You have increasing abdominal pain.  You feel sick to your stomach (nauseous), and you throw up (vomit).  You develop a fever that comes on suddenly.  You have abdominal pain during a bowel movement.  Your menstrual periods become heavier than usual. MAKE SURE YOU:  Understand these instructions.  Will watch your condition.  Will get help right away if you are not doing well or get worse.   This information is not intended to replace advice given to you by your health care provider. Make sure you discuss any questions you have with your health care provider.   Document Released: 05/16/2005 Document Revised: 05/21/2013 Document Reviewed: 01/21/2013 Elsevier Interactive Patient Education 2016 [employer]  

## 2015-11-30 NOTE — Progress Notes (Signed)
Patient ID: Terri Simpson, female   DOB: 1985/09/08, 30 y.o.   MRN: [id number] History:  30 y.o. G3P0030 here today for eval of right ov cyst. [facility] to pelvic pain that was severe.  She reports that the pain has resolved presently.  But, with 'effort' the pain returns.  te pain may return with heavy lifting.  That pain that comes at that time is intermittent but, goes away after 2 hours of the pain coming back.  Pt has been taking pain meds with good relief of sx.           The following portions of the patient's history were reviewed and updated as appropriate: allergies, current medications, past family history, past medical history, past social history, past surgical history and problem list.  Review of Systems:  Pertinent items are noted in HPI.  Objective:  Physical Exam Blood pressure 98/85, pulse 88, height 5' (1.524 m), weight 189 lb (85.73 kg), last menstrual period 11/17/2015, not currently breastfeeding. Gen: NAD Lungs: CTA CV: RRR Abd: Soft, nontender and nondistended Pelvic: Normal appearing external genitalia; normal appearing vaginal mucosa and cervix.  Normal discharge.  Small uterus, no other palpable masses, no uterine or adnexal tenderness  Labs and Imaging [place] Transvaginal Non-ob  11/09/2015  CLINICAL DATA:  Bilateral pelvic pain beginning at 10:30 this morning. EXAM: TRANSABDOMINAL AND TRANSVAGINAL ULTRASOUND OF PELVIS TECHNIQUE: Both transabdominal and transvaginal ultrasound examinations of the pelvis were performed. Transabdominal technique was performed for global imaging of the pelvis including uterus, ovaries, adnexal regions, and pelvic cul-de-sac. It was necessary to proceed with endovaginal exam following the transabdominal exam to visualize the ovaries. COMPARISON:  None FINDINGS: Uterus Measurements: 7.3 x 3.2 x 4.8 cm. No fibroids or other mass visualized. Endometrium Thickness: 0.7 cm.  No focal abnormality visualized. Right ovary  Measurements: 4.3 x 3.3 x 3.8 cm. A cyst with lacy internal echoes measures 2.4 x 1.6 x 1.7 cm. Left ovary Measurements: 3.0 x 1.6 x 2.5 cm. Normal appearance/no adnexal mass. Other findings A moderate volume of free pelvic fluid containing some debris is identified. IMPRESSION: Findings most consistent with a 2.4 cm hemorrhagic right ovarian cyst. Small to moderate volume of free fluid containing debris could be related to the patient's hemorrhagic cyst but the source of the fluid cannot be definitively identified. Electronically Signed   By: Drusilla Kannerhomas  Dalessio M.D.   On: 11/09/2015 15:09   [place] Pelvis Complete  11/09/2015  CLINICAL DATA:  Bilateral pelvic pain beginning at 10:30 this morning. EXAM: TRANSABDOMINAL AND TRANSVAGINAL ULTRASOUND OF PELVIS TECHNIQUE: Both transabdominal and transvaginal ultrasound examinations of the pelvis were performed. Transabdominal technique was performed for global imaging of the pelvis including uterus, ovaries, adnexal regions, and pelvic cul-de-sac. It was necessary to proceed with endovaginal exam following the transabdominal exam to visualize the ovaries. COMPARISON:  None FINDINGS: Uterus Measurements: 7.3 x 3.2 x 4.8 cm. No fibroids or other mass visualized. Endometrium Thickness: 0.7 cm.  No focal abnormality visualized. Right ovary Measurements: 4.3 x 3.3 x 3.8 cm. A cyst with lacy internal echoes measures 2.4 x 1.6 x 1.7 cm. Left ovary Measurements: 3.0 x 1.6 x 2.5 cm. Normal appearance/no adnexal mass. Other findings A moderate volume of free pelvic fluid containing some debris is identified. IMPRESSION: Findings most consistent with a 2.4 cm hemorrhagic right ovarian cyst. Small to moderate volume of free fluid containing debris could be related to the patient's hemorrhagic cyst but the source of the  fluid cannot be definitively identified. Electronically Signed   By: Drusilla Kannerhomas  Dalessio M.D.   On: 11/09/2015 15:09    Assessment & Plan:  Right ov cyst-  hemorrhagic.  Naproxen 500mg  bid with food F/u in 3 months for annual with PAP  Cylus Douville L. Harraway-Smith, M.D., Evern CoreFACOG

## 2015-11-30 NOTE — Progress Notes (Signed)
Video Interpreter # [id number] used for today's visit

## 2016-01-22 ENCOUNTER — Ambulatory Visit: Payer: Self-pay | Attending: Internal Medicine | Admitting: Physician Assistant

## 2016-01-22 ENCOUNTER — Encounter: Payer: Self-pay | Admitting: Physician Assistant

## 2016-01-22 VITALS — BP 129/85 | HR 84 | Temp 97.6°F | Resp 16 | Wt 195.6 lb

## 2016-01-22 DIAGNOSIS — R110 Nausea: Secondary | ICD-10-CM | POA: Insufficient documentation

## 2016-01-22 DIAGNOSIS — R102 Pelvic and perineal pain: Secondary | ICD-10-CM | POA: Insufficient documentation

## 2016-01-22 DIAGNOSIS — R1030 Lower abdominal pain, unspecified: Secondary | ICD-10-CM

## 2016-01-22 DIAGNOSIS — H1012 Acute atopic conjunctivitis, left eye: Secondary | ICD-10-CM | POA: Insufficient documentation

## 2016-01-22 LAB — POCT URINALYSIS DIPSTICK
Glucose, UA: NEGATIVE
LEUKOCYTES UA: NEGATIVE
Nitrite, UA: NEGATIVE
PH UA: 5.5
Protein, UA: 100
Spec Grav, UA: 1.025
Urobilinogen, UA: 1

## 2016-01-22 LAB — POCT URINE PREGNANCY: Preg Test, Ur: NEGATIVE

## 2016-01-22 MED ORDER — KETOTIFEN FUMARATE 0.025 % OP SOLN: 1.0000 [drp] | Freq: Two times a day (BID) | OPHTHALMIC | Status: AC

## 2016-01-22 MED ORDER — ONDANSETRON HCL 4 MG PO TABS: 4.0000 mg | ORAL_TABLET | Freq: Three times a day (TID) | ORAL | 0 refills | Status: DC | PRN

## 2016-01-22 MED ORDER — NAPROXEN 500 MG PO TABS: 500.0000 mg | ORAL_TABLET | Freq: Two times a day (BID) | ORAL | 0 refills | Status: AC | PRN

## 2016-01-22 MED FILL — ?ONDANSETRON HCL 4 MG TABLE: 4 | 6 days supply | Qty: 20 | Fill #0

## 2016-01-22 MED FILL — NAPROXEN 500 MG TABLET: 500 | 30 days supply | Qty: 60 | Fill #0

## 2016-01-22 NOTE — Progress Notes (Signed)
Terri Simpson, is a 30 y.o. female  ZOX:[id number]  [id number]  DOB - 07-18-1985  Subjective:  Chief Complaint and HPI: Terri Simpson is a 30 y.o. female here today to establish care and for a 1 day history of lower abdominal pain.  No f/c.  Her LMP was about 6-7 weeks ago and she just started her period yesterday.  She is also having nausea without vomiting.  The pain comes and goes.  No precipitating or alleviating factors.  She denies STD risk factors.  Married and monogamous.  Not using birth control.  Desires pregnancy. Some pain wihen trying to urinate but denies burning.  No vaginal discharge.  Also L eye itching and occasionally swelling X 6 months.  Occasionally tears/crusting.  No vision changes.    Patient refused translating service and her best friend is interpreting for her.   PMH R hemorrhagic ovarian cyst 11/09/2015 that resolved and followed by Dr Erin Fulling.   ED/Hospital notes reviewed.     ROS:   Constitutional:  No f/c, No night sweats, No unexplained weight loss. EENT:  No vision changes, No blurry vision, No hearing changes. No mouth, throat, or ear problems.  Respiratory: No cough, No SOB Cardiac: No CP, no palpitations GI:  + abd pain, +nausea.  No vomiting. No diarrhea. No constipation. GU: No Urinary s/sx Musculoskeletal: No joint pain Neuro: No headache, no dizziness, no motor weakness.  Skin: No rash Endocrine:  No polydipsia. No polyuria.  Psych: Denies SI/HI  No problems updated.  ALLERGIES: No Known Allergies  PAST MEDICAL HISTORY: Past Medical History:  Diagnosis Date  . Lesion of skin of face 04/21/2014   left cheek  . 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 Authorizing Provider  naproxen (NAPROSYN) 500 MG tablet Take 1 tablet (500 mg total) by mouth 2 (two) times daily as needed for moderate  pain. X 5 days then prn 01/22/16 01/21/17 Yes Anders Simmonds, PA-C  HYDROcodone-acetaminophen (NORCO/VICODIN) 5-325 MG tablet Take 1-2 tablets by mouth every 6 (six) hours as needed. Patient not taking: Reported on 01/22/2016 11/09/15   Santiago Glad, PA-C  Omega-3 Fatty Acids (FISH OIL PO) Take 1 capsule by mouth daily.    Historical Provider, MD  ondansetron (ZOFRAN) 4 MG tablet Take 1 tablet (4 mg total) by mouth every 8 (eight) hours as needed for nausea or vomiting. 01/22/16   Anders Simmonds, PA-C     Objective:  EXAM:   Vitals:   01/22/16 1153  BP: 129/85  Pulse: 84  Resp: 16  Temp: 97.6 F (36.4 C)  TempSrc: Oral  SpO2: 98%  Weight: 195 lb 9.6 oz (88.7 kg)    General appearance : A&OX3. NAD. Non-toxic-appearing HEENT: Atraumatic and Normocephalic.  PERRLA. EOM intact. L eye with mild erythema of conjunctivae.  No swelling.  Fundi benign.  TM clear B. Mouth-MMM, post pharynx WNL w/o erythema, No PND. Neck: supple, no JVD. No cervical lymphadenopathy. No thyromegaly Chest/Lungs:  Breathing-non-labored, Good air entry bilaterally, breath sounds normal without rales, rhonchi, or wheezing  CVS: S1 S2 regular, no murmurs, gallops, rubs  Abdomen: Bowel sounds present, Non tender and not distended with no gaurding, rigidity or rebound.  Neg Psoas, illipsoas, McBurneys, Murrays.  Extremities: Bilateral Lower Ext shows no edema, both legs are warm to touch with = pulse throughout Neurology:  CN II-XII grossly intact, Non focal.  Psych:  TP linear. J/I WNL. Normal speech. Appropriate eye contact and affect.  Skin:  No Rash  Assessment & Plan   1. Lower abdominal pain Non-acute abdomen - GC/chlamydia probe amp, urine - POCT urinalysis dipstick - POCT urine pregnancy Naproxen  2. Allergic conjunctivitis, left - ketotifen (ZADITOR) 0.025 % ophthalmic solution 1 drop; Place 1 drop into the left eye 2 (two) times daily.   3. Pelvic pain in female-h/o spontaneously resolving  hemorrhagic cyst 10/2015 - naproxen (NAPROSYN) 500 MG tablet; Take 1 tablet (500 mg total) by mouth 2 (two) times daily as needed for moderate pain. X 5 days then prn  Dispense: 60 tablet; Refill: 0 No indication for U/S at this time.    5. Nausea without vomiting - ondansetron (ZOFRAN) 4 MG tablet; Take 1 tablet (4 mg total) by mouth every 8 (eight) hours as needed for nausea or vomiting.  Dispense: 20 tablet; Refill: 0   Patient have been counseled extensively about nutrition and exercise  F/up 3 months to establish with a PCP;  Sooner if any needs arise.    The patient was given clear instructions to go to ER or return to medical center if symptoms don't improve, worsen or new problems develop. The patient verbalized understanding. The patient was told to call to get lab results if they haven't heard anything in the next week.     Georgian CoAngela Kimiye Strathman, PA-C [facility] [place], [place] [phone]   01/22/2016, 12:13 PM

## 2016-01-22 NOTE — Progress Notes (Signed)
Pt states she was having pain yesterday in her abdomen but no pain today Pt her left has been getting red and swollen

## 2016-01-25 LAB — GC/CHLAMYDIA PROBE AMP
CT PROBE, AMP APTIMA: NOT DETECTED
GC PROBE AMP APTIMA: NOT DETECTED

## 2016-02-11 ENCOUNTER — Ambulatory Visit: Payer: Self-pay | Attending: Internal Medicine

## 2016-02-11 ENCOUNTER — Ambulatory Visit (HOSPITAL_BASED_OUTPATIENT_CLINIC_OR_DEPARTMENT_OTHER): Payer: Self-pay

## 2016-02-11 DIAGNOSIS — Z23 Encounter for immunization: Secondary | ICD-10-CM

## 2016-02-29 ENCOUNTER — Ambulatory Visit (HOSPITAL_COMMUNITY)
Admission: EM | Admit: 2016-02-29 | Discharge: 2016-02-29 | Disposition: A | Discharge status: Home / Self Care | Payer: Self-pay | Attending: Internal Medicine | Admitting: Internal Medicine

## 2016-02-29 ENCOUNTER — Encounter (HOSPITAL_COMMUNITY): Payer: Self-pay | Admitting: *Deleted

## 2016-02-29 DIAGNOSIS — H01001 Unspecified blepharitis right upper eyelid: Secondary | ICD-10-CM

## 2016-02-29 DIAGNOSIS — H01004 Unspecified blepharitis left upper eyelid: Secondary | ICD-10-CM

## 2016-02-29 MED ORDER — ERYTHROMYCIN 5 MG/GM OP OINT: TOPICAL_OINTMENT | OPHTHALMIC | 0 refills | Status: DC

## 2016-02-29 MED ORDER — OCUSOFT LID SCRUB PLUS EX PADS: 1.0000 | MEDICATED_PAD | Freq: Two times a day (BID) | CUTANEOUS | 1 refills | Status: AC

## 2016-02-29 MED FILL — ERYTHROMYCIN EYE OINTMENT: 5 | 3 days supply | Qty: 4 | Fill #0

## 2016-02-29 NOTE — ED Triage Notes (Addendum)
Patient reports 6 months history of itching and drainage to eyes associated with sneezing. Patient reports it is worse when she is outside. No blurred vision or visual changes with the itching. Patient was seen by PA at the end of August but did not fill prescription for eye drops.

## 2016-02-29 NOTE — ED Provider Notes (Signed)
[facility]    CSN: [id number] Arrival date & time: 02/29/16  1026     History   Chief Complaint Chief Complaint  Patient presents with  . Eye Problem  . Eye Drainage    HPI Terri Simpson is a 30 y.o. female. She presents today with itchy, irritated, sometimes sore, eyelids, for at least the last 6 months. Sometimes has some runny nose and sneezing. Tried a course of allergy eyedrops, ketotifen, for 5 days without significant relief. Uses some OTC drops as needed. No fever, no blurry vision. Does not wear contacts. Frustrated.  HPI  Past Medical History:  Diagnosis Date  . Lesion of skin of face 04/21/2014   left cheek  . 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 Otosclerosis 04/28/2014    Past Surgical History:  Procedure Laterality Date  . STAPEDECTOMY Left   . STAPEDECTOMY Right 04/28/2014   Procedure: RIGHT STAPEDECTOMY;  Surgeon: Serena Colonel, MD;  Location: [facility];  Service: ENT;  Laterality: Right;    OB History    Gravida Para Term Preterm AB Living   3 0 0 0 3 0   SAB TAB Ectopic Multiple Live Births   3 0 0 0         Home Medications    Prior to Admission medications   Medication Sig Start Date End Date Taking? Authorizing Provider  erythromycin ophthalmic ointment Place a 1/2 inch ribbon of ointment along the upper and lower lids qhs and massage in. 02/29/16   Eustace Moore, MD  Eyelid Cleansers (OCUSOFT LID SCRUB PLUS) PADS Apply 1 each topically 2 (two) times daily. 02/29/16 03/15/16  Eustace Moore, MD  naproxen (NAPROSYN) 500 MG tablet Take 1 tablet (500 mg total) by mouth 2 (two) times daily as needed for moderate pain. X 5 days then prn 01/22/16 01/21/17  Anders Simmonds, PA-C  Omega-3 Fatty Acids (FISH OIL PO) Take 1 capsule by mouth daily.    Historical Provider, MD  ondansetron (ZOFRAN) 4 MG tablet Take 1 tablet (4 mg total) by mouth  every 8 (eight) hours as needed for nausea or vomiting. 01/22/16   Anders Simmonds, PA-C    Family History History reviewed. No pertinent family history.  Social History Social History  Substance Use Topics  . Smoking status: Never Smoker  . Smokeless tobacco: Never Used  . Alcohol use No     Allergies   Review of patient's allergies indicates no known allergies.   Review of Systems Review of Systems  All other systems reviewed and are negative.    Physical Exam Triage Vital Signs ED Triage Vitals  Enc Vitals Group     BP 02/29/16 1123 120/71     Pulse Rate 02/29/16 1123 97     Resp 02/29/16 1123 12     Temp 02/29/16 1123 98.4 F (36.9 C)     Temp Source 02/29/16 1123 Oral     SpO2 02/29/16 1123 100 %     Weight --      Height --      Pain Score 02/29/16 1147 3   Updated Vital Signs BP 120/71 (BP Location: Left Arm)   Pulse 97   Temp 98.4 F (36.9 C) (Oral)   Resp 12   SpO2 100%  Physical Exam  Constitutional: She is oriented to person, place, and time. No distress.  Alert,  nicely groomed  HENT:  Head: Atraumatic.  Bilateral TMs are mildly dull, no erythema  moderate nasal congestion bilaterally Throat is pink   Eyes:  Conjugate gaze, no eye redness/drainage Dark circles under both eyes Upper lids are thickened, faintly erythematous, with faint crusting along the eyelashes Conjunctivae are clear bilaterally, no drainage  Neck: Neck supple.  Cardiovascular: Normal rate.   Pulmonary/Chest: No respiratory distress.  Abdominal: She exhibits no distension.  Musculoskeletal: Normal range of motion.  No leg swelling  Neurological: She is alert and oriented to person, place, and time.  Skin: Skin is warm and dry.  No cyanosis  Nursing note and vitals reviewed.    UC Treatments / Results   Procedures Procedures (including critical care time)      None today  Final Clinical Impressions(s) / UC Diagnoses   Final diagnoses:  Blepharitis of upper  eyelids of both eyes, unspecified type   Use lid scrubs and erythromycin ointment as directed.  Recheck or followup with primary care provider Deepak Advani or eye doctor if symptoms are not improving in a week or two.  A nasal steroid like flonase or nasacort, used daily, may also be helpful if nasal congestion/sneezing are frequent symptoms.  Nose and eye symptoms often affect each other and a medicine that helps one may help the other.  New Prescriptions New Prescriptions   ERYTHROMYCIN OPHTHALMIC OINTMENT    Place a 1/2 inch ribbon of ointment along the upper and lower lids qhs and massage in.   EYELID CLEANSERS (OCUSOFT LID SCRUB PLUS) PADS    Apply 1 each topically 2 (two) times daily.     Eustace MooreLaura W Ferlin Fairhurst, MD 03/02/16 1455

## 2016-02-29 NOTE — Discharge Instructions (Addendum)
Use lid scrubs and erythromycin ointment as directed.  Recheck or followup with primary care provider Deepak Advani or eye doctor if symptoms are not improving in a week or two.  A nasal steroid like flonase or nasacort, used daily, may also be helpful if nasal congestion/sneezing are frequent symptoms.  Nose and eye symptoms often affect each other and a medicine that helps one may help the other.

## 2016-03-11 ENCOUNTER — Ambulatory Visit (INDEPENDENT_AMBULATORY_CARE_PROVIDER_SITE_OTHER): Payer: Self-pay | Admitting: Obstetrics & Gynecology

## 2016-03-11 ENCOUNTER — Encounter: Payer: Self-pay | Admitting: Obstetrics & Gynecology

## 2016-03-11 VITALS — BP 118/69 | HR 118 | Ht 65.0 in | Wt 188.0 lb

## 2016-03-11 DIAGNOSIS — Z01419 Encounter for gynecological examination (general) (routine) without abnormal findings: Secondary | ICD-10-CM

## 2016-03-11 DIAGNOSIS — Z124 Encounter for screening for malignant neoplasm of cervix: Secondary | ICD-10-CM

## 2016-03-11 DIAGNOSIS — L680 Hirsutism: Secondary | ICD-10-CM

## 2016-03-11 LAB — CBC
HCT: 40.7 % (ref 35.0–45.0)
HEMOGLOBIN: 13.5 g/dL (ref 11.7–15.5)
MCH: 27.5 pg (ref 27.0–33.0)
MCHC: 33.2 g/dL (ref 32.0–36.0)
MCV: 82.9 fL (ref 80.0–100.0)
MPV: 12.6 fL — ABNORMAL HIGH (ref 7.5–12.5)
PLATELETS: 198 10*3/uL (ref 140–400)
RBC: 4.91 MIL/uL (ref 3.80–5.10)
RDW: 14.1 % (ref 11.0–15.0)
WBC: 5.9 10*3/uL (ref 3.8–10.8)

## 2016-03-11 LAB — FOLLICLE STIMULATING HORMONE: FSH: 4.4 m[IU]/mL

## 2016-03-11 LAB — TSH: TSH: 1.98 m[IU]/L

## 2016-03-11 LAB — PROLACTIN: PROLACTIN: 8 ng/mL

## 2016-03-11 LAB — DHEA-SULFATE: DHEA SO4: 145 ug/dL (ref 18–391)

## 2016-03-11 NOTE — Progress Notes (Signed)
Subjective:     Terri Simpson is a 30 y.o. female here for a routine exam.  Current complaints: Pt with a h/o ovarian cysts. Her last episode was in June.  Pt reports increased hair growth and reports hot flushes at night.  Pt usually has monthly cycles that last 5 days.  She reports painful cycles.  She took Naproxen during her last cycle and it helped with the pain. Pt reports excessive hair growth on her face and breast and all over her body. She said that this is not familial. She reports that she is gaining weight and has poor exercise intolerance.    She exercises daily but, does not do any weight training.   Gynecologic History Patient's last menstrual period was 02/21/2016. Contraception: none Last mammogram: n/a  Obstetric History OB History  Gravida Para Term Preterm AB Living  3 0 0 0 3 0  SAB TAB Ectopic Multiple Live Births  3 0 0 0      # Outcome Date GA Lbr Len/2nd Weight Sex Delivery Anes PTL Lv  3 SAB           2 SAB           1 SAB               The following portions of the patient's history were reviewed and updated as appropriate: allergies, current medications, past family history, past medical history, past social history, past surgical history and problem list.  Review of Systems Pertinent items are noted in HPI.    Objective:   BP 118/69   Pulse (!) 118   Ht 5\' 5"  (1.651 m)   Wt 188 lb (85.3 kg)   LMP 02/21/2016   BMI 31.28 kg/m  General Appearance:    Alert, cooperative, no distress, appears stated age  Head:    Normocephalic, without obvious abnormality, atraumatic  Eyes:    conjunctiva/corneas clear, EOM's intact, both eyes  Ears:    Normal external ear canals, both ears  Nose:   Nares normal, septum midline, mucosa normal, no drainage    or sinus tenderness  Throat:   Lips, mucosa, and tongue normal; teeth and gums normal  Neck:   Supple, symmetrical, trachea midline, no adenopathy;    thyroid:  no enlargement/tenderness/nodules   Back:     Symmetric, no curvature, ROM normal, no CVA tenderness  Lungs:     Clear to auscultation bilaterally, respirations unlabored  Chest Wall:    No tenderness or deformity   Heart:    Regular rate and rhythm, S1 and S2 normal, no murmur, rub   or gallop  Breast Exam:    No tenderness, masses, or nipple abnormality  Abdomen:     Soft, non-tender, bowel sounds active all four quadrants,    no masses, no organomegaly  Genitalia:    Normal female without lesion, discharge or tenderness     Extremities:   Extremities normal, atraumatic, no cyanosis or edema  Pulses:   2+ and symmetric all extremities  Skin:   Skin color, texture, turgor normal, no rashes or lesions; hair on face is absent but, stubble is noted.  Pt threads entire face to remove excessive hair     CLINICAL DATA:  Bilateral pelvic pain beginning at 10:30 this morning.  EXAM: TRANSABDOMINAL AND TRANSVAGINAL ULTRASOUND OF PELVIS  TECHNIQUE: Both transabdominal and transvaginal ultrasound examinations of the pelvis were performed. Transabdominal technique was performed for global imaging of the pelvis including uterus, ovaries,  adnexal regions, and pelvic cul-de-sac. It was necessary to proceed with endovaginal exam following the transabdominal exam to visualize the ovaries.  COMPARISON:  None  FINDINGS: Uterus  Measurements: 7.3 x 3.2 x 4.8 cm. No fibroids or other mass visualized.  Endometrium  Thickness: 0.7 cm.  No focal abnormality visualized.  Right ovary  Measurements: 4.3 x 3.3 x 3.8 cm. A cyst with lacy internal echoes measures 2.4 x 1.6 x 1.7 cm.  Left ovary  Measurements: 3.0 x 1.6 x 2.5 cm. Normal appearance/no adnexal mass.  Other findings  A moderate volume of free pelvic fluid containing some debris is identified.  IMPRESSION: Findings most consistent with a 2.4 cm hemorrhagic right ovarian cyst.  Small to moderate volume of free fluid containing debris could  be related to the patient's hemorrhagic cyst but the source of the fluid cannot be definitively identified. Assessment:    Healthy female exam.   Hirsuitism Fatigue Weight gain   Plan:    Follow up in: 3 months.    Labs: TSH, FHS, prolactin, DHEAS, 17-OH P Rec exercise per 6 day per week rec Weight [employer]chers  F/u PAP  Renie Stelmach L. Harraway-Smith, M.D., Evern Core

## 2016-03-11 NOTE — Progress Notes (Signed)
Kathrynn DuckingMaha Mohamed used for today's visit as interpreter

## 2016-03-11 NOTE — Patient Instructions (Signed)
w Exercising to Lose Weight Exercising can help you to lose weight. In order to lose weight through exercise, you need to do vigorous-intensity exercise. You can tell that you are exercising with vigorous intensity if you are breathing very hard and fast and cannot hold a conversation while exercising. Moderate-intensity exercise helps to maintain your current weight. You can tell that you are exercising at a moderate level if you have a higher heart rate and faster breathing, but you are still able to hold a conversation. HOW OFTEN SHOULD I EXERCISE? Choose an activity that you enjoy and set realistic goals. Your health care provider can help you to make an activity plan that works for you. Exercise regularly as directed by your health care provider. This may include:  Doing resistance training twice each week, such as:  Push-ups.  Sit-ups.  Lifting weights.  Using resistance bands.  Doing a given intensity of exercise for a given amount of time. Choose from these options:  150 minutes of moderate-intensity exercise every week.  75 minutes of vigorous-intensity exercise every week.  A mix of moderate-intensity and vigorous-intensity exercise every week. Children, pregnant women, people who are out of shape, people who are overweight, and older adults may need to consult a health care provider for individual recommendations. If you have any sort of medical condition, be sure to consult your health care provider before starting a new exercise program. WHAT ARE SOME ACTIVITIES THAT CAN HELP ME TO LOSE WEIGHT?   Walking at a rate of at least 4.5 miles an hour.  Jogging or running at a rate of 5 miles per hour.  Biking at a rate of at least 10 miles per hour.  Lap swimming.  Roller-skating or in-line skating.  Cross-country skiing.  Vigorous competitive sports, such as football, basketball, and soccer.  Jumping rope.  Aerobic dancing. HOW CAN I BE MORE ACTIVE IN MY DAY-TO-DAY  ACTIVITIES?  Use the stairs instead of the elevator.  Take a walk during your lunch break.  If you drive, park your car farther away from work or school.  If you take public transportation, get off one stop early and walk the rest of the way.  Make all of your phone calls while standing up and walking around.  Get up, stretch, and walk around every 30 minutes throughout the day. WHAT GUIDELINES SHOULD I FOLLOW WHILE EXERCISING?  Do not exercise so much that you hurt yourself, feel dizzy, or get very short of breath.  Consult your health care provider prior to starting a new exercise program.  Wear comfortable clothes and shoes with good support.  Drink plenty of water while you exercise to prevent dehydration or heat stroke. Body water is lost during exercise and must be replaced.  Work out until you breathe faster and your heart beats faster.   This information is not intended to replace advice given to you by your health care provider. Make sure you discuss any questions you have with your health care provider.   Document Released: 06/18/2010 Document Revised: 06/06/2014 Document Reviewed: 10/17/2013 Elsevier Interactive Patient Education 2016 [employer]   [email or web address]

## 2016-03-14 LAB — CYTOLOGY - PAP: Diagnosis: NEGATIVE

## 2016-03-14 LAB — 17-HYDROXYPROGESTERONE: 17-OH-PROGESTERONE, LC/MS/MS: 148 ng/dL

## 2016-11-12 ENCOUNTER — Encounter (HOSPITAL_COMMUNITY): Payer: Self-pay | Admitting: *Deleted

## 2016-11-12 ENCOUNTER — Ambulatory Visit (HOSPITAL_COMMUNITY)
Admission: EM | Admit: 2016-11-12 | Discharge: 2016-11-12 | Disposition: A | Discharge status: Home / Self Care | Payer: Self-pay | Attending: Family Medicine | Admitting: Family Medicine

## 2016-11-12 DIAGNOSIS — T7840XA Allergy, unspecified, initial encounter: Secondary | ICD-10-CM

## 2016-11-12 DIAGNOSIS — J309 Allergic rhinitis, unspecified: Secondary | ICD-10-CM

## 2016-11-12 NOTE — ED Provider Notes (Signed)
CSN: [id number]     Arrival date & time 11/12/16  1506 History   First MD Initiated Contact with Patient 11/12/16 1536     Chief Complaint  Patient presents with  . Facial Swelling   (Consider location/radiation/quality/duration/timing/severity/associated sxs/prior Treatment) Patient came in today for left eye swelling accompany by pain and itchiness onset today. However this is a recurrent problem. She woke up this morning feeling fine, went to college orientation and looked in the mirror when she came back and noticed that her left eye was swollen. The swelling has gone down spontaneously since. She reports that she couldn't open her left eye 1 hr ago but is now able to open her eye. She also endorses history of allergic rhinitis with sneezing and running nose. She states that this has happened 4 times this month; sometimes she would wake up with her eye swollen, which usually goes away spontaneously over the course of the day. She denies any visual distrubances.        Past Medical History:  Diagnosis Date  . Lesion of skin of face 04/21/2014   left cheek  . 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 STAPEDECTOMY Left   . STAPEDECTOMY Right 04/28/2014   Procedure: RIGHT STAPEDECTOMY;  Surgeon: Serena Colonel, MD;  Location: [facility];  Service: ENT;  Laterality: Right;   No family history on file. Social History  Substance Use Topics  . Smoking status: Never Smoker  . Smokeless tobacco: Never Used  . Alcohol use No   OB History    Gravida Para Term Preterm AB Living   3 0 0 0 3 0   SAB TAB Ectopic Multiple Live Births   3 0 0 0       Review of Systems  Constitutional:       See HPI     Allergies  Patient has no known allergies.  Home Medications   Prior to Admission medications   Medication Sig Start Date End Date Taking? Authorizing Provider  erythromycin ophthalmic ointment Place  a 1/2 inch ribbon of ointment along the upper and lower lids qhs and massage in. 02/29/16   Eustace Moore, MD  naproxen (NAPROSYN) 500 MG tablet Take 1 tablet (500 mg total) by mouth 2 (two) times daily as needed for moderate pain. X 5 days then prn 01/22/16 01/21/17  Anders Simmonds, PA-C  Omega-3 Fatty Acids (FISH OIL PO) Take 1 capsule by mouth daily.    [provider]  ondansetron (ZOFRAN) 4 MG tablet Take 1 tablet (4 mg total) by mouth every 8 (eight) hours as needed for nausea or vomiting. 01/22/16   Anders Simmonds, PA-C   Meds Ordered and Administered this Visit  Medications - No data to display  BP 105/78   Pulse 100   Temp 97.8 F (36.6 C) (Oral)   Resp 16   LMP 11/01/2016 (Exact Date)   SpO2 100%  No data found.   Physical Exam  Constitutional: She appears well-developed and well-nourished.  HENT:  Head: Normocephalic and atraumatic.  Right Ear: External ear normal.  Left Ear: External ear normal.  Nose: Nose normal.  Mouth/Throat: Oropharynx is clear and moist. No oropharyngeal exudate.  Eyes: Conjunctivae are normal. Pupils are equal, round, and reactive to light. Right eye exhibits no discharge. Left eye exhibits no discharge.  Left upper eyelid is swollen but no erythema. No matting or  discharge noted. Visual acuity intact.   Neck: Normal range of motion. Neck supple.  Cardiovascular: Normal rate, regular rhythm and normal heart sounds.   Pulmonary/Chest: Effort normal and breath sounds normal.  Lymphadenopathy:    She has no cervical adenopathy.  Skin: Skin is warm and dry.  Nursing note and vitals reviewed.   Urgent Care Course     Procedures (including critical care time)  Labs Review Labs Reviewed - No data to display  Imaging Review No results found.   Visual Acuity Review  Right Eye Distance: 20/25 Left Eye Distance: 20/25 Bilateral Distance: 20/20  Right Eye Near:   Left Eye Near:    Bilateral Near:         MDM   1.  Allergic rhinitis, unspecified seasonality, unspecified trigger    Physical examination reveals swollen left upper eyelid but no erythema, matting or discharge. Visual acuity is intact. Swelling has improved without treatment since the initial onset. This is a frequently recurrent problem. She has bilateral eye itchiness.   Believed to be due to allergy. Take 1-2 tabs of benadryl when you get home and see if this will help. Avoid any make up such as eyeliner or mascara for the next few days. Start zyrtec 10 mg daily. F/u with PCP as scheduled next week for follow up.      Lucia EstelleZheng, Nicolet Griffy, NP 11/12/16 [phone]

## 2016-11-12 NOTE — ED Triage Notes (Signed)
C/O left eyelid swelling onset today with rapid worsening per family.  States has had in past & was told allergy.  Denies any vision changes.

## 2017-08-27 DIAGNOSIS — E782 Mixed hyperlipidemia: Secondary | ICD-10-CM | POA: Insufficient documentation

## 2017-12-20 DIAGNOSIS — E669 Obesity, unspecified: Secondary | ICD-10-CM | POA: Insufficient documentation

## 2017-12-30 ENCOUNTER — Inpatient Hospital Stay (HOSPITAL_COMMUNITY)
Admission: AD | Admit: 2017-12-30 | Discharge: 2017-12-30 | Disposition: A | Discharge status: Home / Self Care | Payer: BLUE CROSS/BLUE SHIELD | Attending: Obstetrics and Gynecology | Admitting: Obstetrics and Gynecology

## 2017-12-30 ENCOUNTER — Encounter (HOSPITAL_COMMUNITY): Payer: Self-pay | Admitting: *Deleted

## 2017-12-30 DIAGNOSIS — N912 Amenorrhea, unspecified: Secondary | ICD-10-CM | POA: Insufficient documentation

## 2017-12-30 NOTE — MAU Note (Signed)
Terri Simpson is a 32 y.o. at Unknown here in MAU reporting:  +possible pregnancy Endorses +HPT yesterday LMP: 11/17/17  +nausea/decrease in appetite +fatigue Denies pain or bleeding at this time Pain score: denies Vitals:   12/30/17 1108  BP: 130/67  Pulse: 89  Resp: 16  Temp: (!) 97.5 F (36.4 C)  SpO2: 97%      Lab orders placed from triage: none Triage discussed with Zorita PangH. Hogan, CNM

## 2017-12-30 NOTE — MAU Provider Note (Signed)
Ms. Terri Simpson is a 32 y.o. G3P0030 who present to MAU today for pregnancy confirmation. She denies abdominal pain or vaginal bleeding.   BP 130/67 (BP Location: Right Arm)   Pulse 89   Temp (!) 97.5 F (36.4 C) (Oral)   Resp 16   Wt 172 lb (78 kg)   LMP 11/17/2017   SpO2 97% Comment: ra  BMI 28.62 kg/m  CONSTITUTIONAL: Well-developed, well-nourished female in no acute distress.  CARDIOVASCULAR: Normal heart rate noted RESPIRATORY: Effort and breath sounds normal GASTROINTESTINAL:Soft, no distention noted.  No tenderness, rebound or guarding.  SKIN: Skin is warm and dry. No rash noted. Not diaphoretic. No erythema. No pallor. PSYCHIATRIC: Normal mood and affect. Normal behavior. Normal judgment and thought content.  MDM Medical screening exam complete Patient does not endorse any symptoms concerning for ectopic pregnancy or pregnancy related complication today.   A:  Amenorrhea  P: Discharge home Patient advised that she can present as a walk-in to [facility] for a pregnancy test M-Th between 8am-4pm or Friday between 8am -11am Reasons to return to MAU reviewed  Patient may return to MAU as needed or if her condition were to change or worsen  Armando ReichertHogan, Kaylinn Dedic D, CNM 12/30/2017 11:11 AM

## 2018-01-01 ENCOUNTER — Ambulatory Visit (INDEPENDENT_AMBULATORY_CARE_PROVIDER_SITE_OTHER): Payer: BLUE CROSS/BLUE SHIELD | Admitting: *Deleted

## 2018-01-01 ENCOUNTER — Encounter: Payer: Self-pay | Admitting: Family Medicine

## 2018-01-01 DIAGNOSIS — Z3201 Encounter for pregnancy test, result positive: Secondary | ICD-10-CM

## 2018-01-01 DIAGNOSIS — Z3200 Encounter for pregnancy test, result unknown: Secondary | ICD-10-CM

## 2018-01-01 LAB — POCT PREGNANCY, URINE: Preg Test, Ur: POSITIVE — AB

## 2018-01-01 NOTE — Progress Notes (Signed)
Here for pregnancy test which was positive. LMP 11/17/17 and sure of that date. This makes her 6455w3d and EDD  08/24/18. States had home pregnancy test positive yesterday and stopped taking lipitor. Discussed with Dr. Shawnie PonsPratt and advised patient to stop taking lipitor. Advised to take otc prenatal care and to start prenatal care. Will send to check out to assist her with finding provider.

## 2018-01-01 NOTE — Progress Notes (Signed)
Patient seen and assessed by nursing staff.  Agree with documentation and plan.  

## 2018-02-01 LAB — OB RESULTS CONSOLE RPR: RPR: NONREACTIVE

## 2018-02-01 LAB — OB RESULTS CONSOLE GC/CHLAMYDIA: Gonorrhea: NEGATIVE

## 2018-02-01 LAB — OB RESULTS CONSOLE HEPATITIS B SURFACE ANTIGEN: Hepatitis B Surface Ag: NEGATIVE

## 2018-05-30 NOTE — L&D Delivery Note (Signed)
Delivery Note Pt received epidural and pushed  for about 20 minutes.  At 9:14 PM a healthy female was delivered via Vaginal, Spontaneous (Presentation: LOA  ).  APGAR: 8, 9; weight pending.   Placenta status: delivered spontaneously, .  Cord:  with the following complications: nuchal x 1 delivered through.    Anesthesia:   Episiotomy: None Lacerations: 2nd degree Suture Repair: 3.0 vicryl rapide Est. Blood Loss (mL):  Mom to postpartum.  Baby to Couplet care / Skin to Skin. D/w parents circumcision with family translating and they state they desire but have not paid here or office.  D/w them office-based vs hospital-based and will get billing staff to call tomorrow.  Terri Simpson 08/05/2018, 9:47 PM

## 2018-06-08 LAB — OB RESULTS CONSOLE HIV ANTIBODY (ROUTINE TESTING): HIV: NONREACTIVE

## 2018-06-08 LAB — OB RESULTS CONSOLE RUBELLA ANTIBODY, IGM: Rubella: IMMUNE

## 2018-08-05 ENCOUNTER — Other Ambulatory Visit: Payer: Self-pay

## 2018-08-05 ENCOUNTER — Encounter (HOSPITAL_COMMUNITY): Payer: Self-pay | Admitting: *Deleted

## 2018-08-05 ENCOUNTER — Inpatient Hospital Stay (HOSPITAL_COMMUNITY): Payer: BLUE CROSS/BLUE SHIELD | Admitting: Anesthesiology

## 2018-08-05 ENCOUNTER — Inpatient Hospital Stay (HOSPITAL_COMMUNITY)
Admission: AD | Admit: 2018-08-05 | Discharge: 2018-08-07 | DRG: 807 | Disposition: A | Discharge status: Home / Self Care | Payer: BLUE CROSS/BLUE SHIELD | Attending: Obstetrics and Gynecology | Admitting: Obstetrics and Gynecology

## 2018-08-05 DIAGNOSIS — D6959 Other secondary thrombocytopenia: Secondary | ICD-10-CM | POA: Diagnosis present

## 2018-08-05 DIAGNOSIS — Z3A37 37 weeks gestation of pregnancy: Secondary | ICD-10-CM | POA: Diagnosis not present

## 2018-08-05 DIAGNOSIS — O9912 Other diseases of the blood and blood-forming organs and certain disorders involving the immune mechanism complicating childbirth: Secondary | ICD-10-CM | POA: Diagnosis present

## 2018-08-05 DIAGNOSIS — O26893 Other specified pregnancy related conditions, third trimester: Secondary | ICD-10-CM | POA: Diagnosis present

## 2018-08-05 LAB — CBC
HCT: 37.5 % (ref 36.0–46.0)
Hemoglobin: 12 g/dL (ref 12.0–15.0)
MCH: 27.3 pg (ref 26.0–34.0)
MCHC: 32 g/dL (ref 30.0–36.0)
MCV: 85.2 fL (ref 80.0–100.0)
Platelets: 132 10*3/uL — ABNORMAL LOW (ref 150–400)
RBC: 4.4 MIL/uL (ref 3.87–5.11)
RDW: 14.2 % (ref 11.5–15.5)
WBC: 7.8 10*3/uL (ref 4.0–10.5)
nRBC: 0 % (ref 0.0–0.2)

## 2018-08-05 LAB — ABO/RH: ABO/RH(D): O POS

## 2018-08-05 LAB — TYPE AND SCREEN
ABO/RH(D): O POS
Antibody Screen: NEGATIVE

## 2018-08-05 LAB — OB RESULTS CONSOLE GBS: GBS: NEGATIVE

## 2018-08-05 LAB — POCT FERN TEST: POCT Fern Test: POSITIVE

## 2018-08-05 MED ORDER — LIDOCAINE HCL (PF) 1 % IJ SOLN
INTRAMUSCULAR | Status: DC | PRN
  Administered 2018-08-05: 6 mL via EPIDURAL

## 2018-08-05 MED ORDER — ACETAMINOPHEN 325 MG PO TABS
650.0000 mg | ORAL_TABLET | ORAL | Status: DC | PRN
  Administered 2018-08-06: 650 mg via ORAL
  Filled 2018-08-05: qty 2

## 2018-08-05 MED ORDER — ACETAMINOPHEN 325 MG PO TABS: 650.0000 mg | ORAL_TABLET | ORAL | Status: DC | PRN

## 2018-08-05 MED ORDER — LIDOCAINE HCL (PF) 1 % IJ SOLN: 30.0000 mL | INTRAMUSCULAR | Status: DC | PRN

## 2018-08-05 MED ORDER — SOD CITRATE-CITRIC ACID 500-334 MG/5ML PO SOLN: 30.0000 mL | ORAL | Status: DC | PRN

## 2018-08-05 MED ORDER — PHENYLEPHRINE 40 MCG/ML (10ML) SYRINGE FOR IV PUSH (FOR BLOOD PRESSURE SUPPORT): 80.0000 ug | PREFILLED_SYRINGE | INTRAVENOUS | Status: DC | PRN

## 2018-08-05 MED ORDER — TETANUS-DIPHTH-ACELL PERTUSSIS 5-2.5-18.5 LF-MCG/0.5 IM SUSP: 0.5000 mL | Freq: Once | INTRAMUSCULAR | Status: DC

## 2018-08-05 MED ORDER — BENZOCAINE-MENTHOL 20-0.5 % EX AERO
1.0000 "application " | INHALATION_SPRAY | CUTANEOUS | Status: DC | PRN
  Administered 2018-08-07 (×2): 1 via TOPICAL
  Filled 2018-08-05 (×3): qty 56

## 2018-08-05 MED ORDER — OXYCODONE HCL 5 MG PO TABS: 10.0000 mg | ORAL_TABLET | ORAL | Status: DC | PRN

## 2018-08-05 MED ORDER — ZOLPIDEM TARTRATE 5 MG PO TABS: 5.0000 mg | ORAL_TABLET | Freq: Every evening | ORAL | Status: DC | PRN

## 2018-08-05 MED ORDER — PHENYLEPHRINE 40 MCG/ML (10ML) SYRINGE FOR IV PUSH (FOR BLOOD PRESSURE SUPPORT)
80.0000 ug | PREFILLED_SYRINGE | INTRAVENOUS | Status: DC | PRN
  Filled 2018-08-05: qty 10

## 2018-08-05 MED ORDER — SIMETHICONE 80 MG PO CHEW: 80.0000 mg | CHEWABLE_TABLET | ORAL | Status: DC | PRN

## 2018-08-05 MED ORDER — OXYTOCIN BOLUS FROM INFUSION
500.0000 mL | Freq: Once | INTRAVENOUS | Status: AC
  Administered 2018-08-05: 500 mL via INTRAVENOUS

## 2018-08-05 MED ORDER — DIBUCAINE 1 % RE OINT: 1.0000 "application " | TOPICAL_OINTMENT | RECTAL | Status: DC | PRN

## 2018-08-05 MED ORDER — FENTANYL CITRATE (PF) 100 MCG/2ML IJ SOLN
100.0000 ug | Freq: Once | INTRAMUSCULAR | Status: AC
  Administered 2018-08-05: 100 ug via EPIDURAL

## 2018-08-05 MED ORDER — OXYCODONE-ACETAMINOPHEN 5-325 MG PO TABS
1.0000 | ORAL_TABLET | ORAL | Status: DC | PRN
Start: 2018-08-05 — End: 2018-08-05

## 2018-08-05 MED ORDER — ONDANSETRON HCL 4 MG PO TABS: 4.0000 mg | ORAL_TABLET | ORAL | Status: DC | PRN

## 2018-08-05 MED ORDER — DIPHENHYDRAMINE HCL 25 MG PO CAPS: 25.0000 mg | ORAL_CAPSULE | Freq: Four times a day (QID) | ORAL | Status: DC | PRN

## 2018-08-05 MED ORDER — DIPHENHYDRAMINE HCL 50 MG/ML IJ SOLN: 12.5000 mg | INTRAMUSCULAR | Status: DC | PRN

## 2018-08-05 MED ORDER — PRENATAL MULTIVITAMIN CH
1.0000 | ORAL_TABLET | Freq: Every day | ORAL | Status: DC
  Administered 2018-08-06 – 2018-08-07 (×2): 1 via ORAL
  Filled 2018-08-05 (×2): qty 1

## 2018-08-05 MED ORDER — LIDOCAINE-EPINEPHRINE (PF) 2 %-1:200000 IJ SOLN: INTRAMUSCULAR | Status: DC | PRN

## 2018-08-05 MED ORDER — SENNOSIDES-DOCUSATE SODIUM 8.6-50 MG PO TABS
2.0000 | ORAL_TABLET | ORAL | Status: DC
  Administered 2018-08-06 – 2018-08-07 (×2): 2 via ORAL
  Filled 2018-08-05 (×2): qty 2

## 2018-08-05 MED ORDER — IBUPROFEN 600 MG PO TABS
600.0000 mg | ORAL_TABLET | Freq: Four times a day (QID) | ORAL | Status: DC
  Administered 2018-08-06 – 2018-08-07 (×7): 600 mg via ORAL
  Filled 2018-08-05 (×7): qty 1

## 2018-08-05 MED ORDER — EPHEDRINE 5 MG/ML INJ: 10.0000 mg | INTRAVENOUS | Status: DC | PRN

## 2018-08-05 MED ORDER — OXYTOCIN 40 UNITS IN NORMAL SALINE INFUSION - SIMPLE MED
1.0000 m[IU]/min | INTRAVENOUS | Status: DC
  Administered 2018-08-05: 2 m[IU]/min via INTRAVENOUS
  Filled 2018-08-05: qty 1000

## 2018-08-05 MED ORDER — FENTANYL-BUPIVACAINE-NACL 0.5-0.125-0.9 MG/250ML-% EP SOLN
12.0000 mL/h | EPIDURAL | Status: DC | PRN
  Filled 2018-08-05: qty 250

## 2018-08-05 MED ORDER — LACTATED RINGERS IV SOLN: 500.0000 mL | INTRAVENOUS | Status: DC | PRN

## 2018-08-05 MED ORDER — ONDANSETRON HCL 4 MG/2ML IJ SOLN: 4.0000 mg | INTRAMUSCULAR | Status: DC | PRN

## 2018-08-05 MED ORDER — OXYCODONE HCL 5 MG PO TABS: 5.0000 mg | ORAL_TABLET | ORAL | Status: DC | PRN

## 2018-08-05 MED ORDER — LACTATED RINGERS IV SOLN
500.0000 mL | Freq: Once | INTRAVENOUS | Status: AC
  Administered 2018-08-05: 500 mL via INTRAVENOUS

## 2018-08-05 MED ORDER — LACTATED RINGERS IV SOLN
INTRAVENOUS | Status: DC
  Administered 2018-08-05 (×2): via INTRAVENOUS

## 2018-08-05 MED ORDER — TERBUTALINE SULFATE 1 MG/ML IJ SOLN: 0.2500 mg | Freq: Once | INTRAMUSCULAR | Status: DC | PRN

## 2018-08-05 MED ORDER — WITCH HAZEL-GLYCERIN EX PADS: 1.0000 "application " | MEDICATED_PAD | CUTANEOUS | Status: DC | PRN

## 2018-08-05 MED ORDER — FENTANYL CITRATE (PF) 100 MCG/2ML IJ SOLN
50.0000 ug | INTRAMUSCULAR | Status: DC | PRN
  Administered 2018-08-05: 100 ug via INTRAVENOUS
  Filled 2018-08-05 (×2): qty 2

## 2018-08-05 MED ORDER — OXYCODONE-ACETAMINOPHEN 5-325 MG PO TABS: 2.0000 | ORAL_TABLET | ORAL | Status: DC | PRN

## 2018-08-05 MED ORDER — OXYTOCIN 40 UNITS IN NORMAL SALINE INFUSION - SIMPLE MED
2.5000 [IU]/h | INTRAVENOUS | Status: DC
  Administered 2018-08-05: 2.5 [IU]/h via INTRAVENOUS

## 2018-08-05 MED ORDER — ONDANSETRON HCL 4 MG/2ML IJ SOLN
4.0000 mg | Freq: Four times a day (QID) | INTRAMUSCULAR | Status: DC | PRN
  Administered 2018-08-05: 4 mg via INTRAVENOUS
  Filled 2018-08-05: qty 2

## 2018-08-05 MED ORDER — COCONUT OIL OIL: 1.0000 "application " | TOPICAL_OIL | Status: DC | PRN

## 2018-08-05 NOTE — Anesthesia Pain Management Evaluation Note (Signed)
  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 We have an anesthesia team available at all times to provide care throughout the hospital, including epidural management and anesthesia for C-section. I don't know your plan for the delivery whether it a natural birth, water birth, IV sedation, nitrous supplementation, doula or epidural, but we want to meet your pain goals."   1.Was your pain managed to your expectations on prior hospitalizations?   No prior hospitalizations  2.What is your expectation for pain management during this hospitalization?     Epidural  3.How can we help you reach that goal? Support prn  Record the patient's initial score and the patient's pain goal.   Pain: 9  Pain Goal: 3 [facility] wants you to be able to say your pain was always managed very well.  [facility] 08/05/2018

## 2018-08-05 NOTE — Anesthesia Preprocedure Evaluation (Signed)
Anesthesia Evaluation  Patient identified by MRN, date of birth, ID band Patient awake    Reviewed: Allergy & Precautions, H&P , NPO status , Patient's Chart, lab work & pertinent test results, reviewed documented beta blocker date and time   Airway Mallampati: II  TM Distance: >3 FB Neck ROM: full    Dental no notable dental hx.    Pulmonary neg pulmonary ROS,    Pulmonary exam normal breath sounds clear to auscultation       Cardiovascular negative cardio ROS Normal cardiovascular exam Rhythm:regular Rate:Normal     Neuro/Psych negative neurological ROS  negative psych ROS   GI/Hepatic negative GI ROS, Neg liver ROS,   Endo/Other  negative endocrine ROS  Renal/GU negative Renal ROS  negative genitourinary   Musculoskeletal   Abdominal   Peds  Hematology negative hematology ROS (+)   Anesthesia Other Findings   Reproductive/Obstetrics (+) Pregnancy                             Anesthesia Physical Anesthesia Plan  ASA: II  Anesthesia Plan: Epidural   Post-op Pain Management:    Induction:   PONV Risk Score and Plan:   Airway Management Planned:   Additional Equipment:   Intra-op Plan:   Post-operative Plan:   Informed Consent: I have reviewed the patients History and Physical, chart, labs and discussed the procedure including the risks, benefits and alternatives for the proposed anesthesia with the patient or authorized representative who has indicated his/her understanding and acceptance.     Dental Advisory Given  Plan Discussed with: CRNA, Anesthesiologist and Surgeon  Anesthesia Plan Comments: (Labs checked- platelets confirmed with RN in room. Fetal heart tracing, per RN, reported to be stable enough for sitting procedure. Discussed epidural, and patient consents to the procedure:  included risk of possible headache,backache, failed block, allergic reaction, and  nerve injury. This patient was asked if she had any questions or concerns before the procedure started.)        Anesthesia Quick Evaluation  

## 2018-08-05 NOTE — Anesthesia Procedure Notes (Addendum)
Epidural Patient location during procedure: OB Start time: 08/05/2018 5:38 PM End time: 08/05/2018 5:45 PM  Staffing Anesthesiologist: Bethena Midget, MD  Preanesthetic Checklist Completed: patient identified, site marked, surgical consent, pre-op evaluation, timeout performed, IV checked, risks and benefits discussed and monitors and equipment checked  Epidural Patient position: sitting Prep: site prepped and draped and DuraPrep Patient monitoring: continuous pulse ox and blood pressure Approach: midline Location: L3-L4 Injection technique: LOR air  Needle:  Needle type: Tuohy  Needle gauge: 17 G Needle length: 9 cm and 9 Needle insertion depth: 6 cm Catheter type: closed end flexible Catheter size: 19 Gauge Catheter at skin depth: 11 cm Test dose: negative  Assessment Events: blood not aspirated, injection not painful, no injection resistance, negative IV test and no paresthesia

## 2018-08-05 NOTE — MAU Note (Signed)
Presents with c/o LOF since 1230 this afternoon.  Reports fluid is pink tinged.  Denies VB.  Reports decreased FM.

## 2018-08-05 NOTE — H&P (Signed)
Terri Simpson is a 33 y.o. female G2P0010 at 82 2/7 weeks (EDD 08/24/18 by LMP c/w 8 week [place]) presenting for SROM in the last couple of hours.   Prenatal care relatively insignificant.  Had mild gestational thrombocytopenia with platelets 140K at 28 weeks.  Pt reports contractions she rates 5/10.   Past OB hx SAB x 1  Past Medical History:  Diagnosis Date  . Lesion of skin of face 04/21/2014   left cheek  . 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 STAPEDECTOMY Left   . STAPEDECTOMY Right 04/28/2014   Procedure: RIGHT STAPEDECTOMY;  Surgeon: Serena Colonel, MD;  Location: [facility];  Service: ENT;  Laterality: Right;   Family History: family history is not on file. Social History:  reports that she has never smoked. She has never used smokeless tobacco. She reports that she does not drink alcohol or use drugs.     Maternal Diabetes: No Genetic Screening: Declined Maternal Ultrasounds/Referrals: Normal Fetal Ultrasounds or other Referrals:  None Maternal Substance Abuse:  No Significant Maternal Medications:  None Significant Maternal Lab Results:  None Other Comments:  None  Review of Systems  Gastrointestinal: Positive for abdominal pain.  Neurological: Negative for tingling.   Maternal Medical History:  Reason for admission: Rupture of membranes.   Contractions: Onset was 1-2 hours ago.   Frequency: regular.   Perceived severity is moderate.    Fetal activity: Perceived fetal activity is normal.    Prenatal Complications - Diabetes: none.    Dilation: 3 Effacement (%): 80 Station: -2 Exam by:: Leafy Ro, RNC Blood pressure 120/72, pulse 91, temperature (!) 97.5 F (36.4 C), temperature source Oral, resp. rate 19, height 5' (1.524 m), weight 89.1 kg, last menstrual period 11/17/2017, SpO2 100 %. Maternal Exam:  Uterine Assessment: Contraction strength is moderate.   Contraction frequency is regular.   Abdomen: Patient reports no abdominal tenderness. Fetal presentation: vertex  Introitus: Normal vulva. Normal vagina.    Physical Exam  Constitutional: She appears well-developed.  Cardiovascular: Normal rate and regular rhythm.  Respiratory: Effort normal.  GI: Soft.  Genitourinary:    Vulva and vagina normal.   Musculoskeletal: Normal range of motion.  Neurological: She is alert.  Psychiatric: She has a normal mood and affect.    Prenatal labs: ABO, Rh:  O positive Antibody:  negaitve Rubella:  Immune RPR:   NR HBsAg:   Neg HIV:   NR GBS:   Neg Hgb AA One hour GCT 66  Assessment/Plan Pt with ROM at term. Will admit and assess for progress and augment if needed.   Oliver Pila 08/05/2018, 2:20 PM

## 2018-08-06 LAB — CBC
HCT: 29.2 % — ABNORMAL LOW (ref 36.0–46.0)
Hemoglobin: 9.6 g/dL — ABNORMAL LOW (ref 12.0–15.0)
MCH: 27.9 pg (ref 26.0–34.0)
MCHC: 32.9 g/dL (ref 30.0–36.0)
MCV: 84.9 fL (ref 80.0–100.0)
Platelets: 117 10*3/uL — ABNORMAL LOW (ref 150–400)
RBC: 3.44 MIL/uL — ABNORMAL LOW (ref 3.87–5.11)
RDW: 14.3 % (ref 11.5–15.5)
WBC: 9.1 10*3/uL (ref 4.0–10.5)
nRBC: 0 % (ref 0.0–0.2)

## 2018-08-06 MED ORDER — SODIUM BICARBONATE 8.4 % IV SOLN: INTRAVENOUS | Status: DC | PRN

## 2018-08-06 MED ORDER — SODIUM CHLORIDE (PF) 0.9 % IJ SOLN
INTRAMUSCULAR | Status: DC | PRN
  Administered 2018-08-05: 12 mL/h via EPIDURAL

## 2018-08-06 NOTE — Anesthesia Postprocedure Evaluation (Signed)
Anesthesia Post Note  Patient: Midwife  Procedure(s) Performed: AN AD HOC LABOR EPIDURAL     Patient location during evaluation: Mother Baby Anesthesia Type: Epidural Level of consciousness: awake and alert Pain management: pain level controlled Vital Signs Assessment: post-procedure vital signs reviewed and stable Respiratory status: spontaneous breathing, nonlabored ventilation and respiratory function stable Cardiovascular status: stable Postop Assessment: no headache, no backache and epidural receding Anesthetic complications: no    Last Vitals:  Vitals:   08/06/18 0030 08/06/18 0500  BP: 119/72 114/67  Pulse: (!) 104 79  Resp: 20 18  Temp: (!) 36.2 C 36.7 C  SpO2: 95% 94%    Last Pain:  Vitals:   08/06/18 1110  TempSrc:   PainSc: 5    Pain Goal: Patients Stated Pain Goal: 5 (08/05/18 1720)                 Marrion Coy

## 2018-08-06 NOTE — Lactation Note (Signed)
This note was copied from a baby's chart. Lactation Consultation Note  Patient Name: Terri Simpson [id number] Date: 08/06/2018 Reason for consult: Initial assessment;Early term 37-38.6wks;Primapara;1st time breastfeeding  P1 mother whose infant is now 31 hours old.  Arabic interpreter [phone]) used for interpretation.  Baby was asleep in bassinet when I arrived.  Mother had visitors present.  Mother did not have any immediate questions regarding breast feeding but stated, "I do not know what I am doing."  She stated that the RN was in her room and she was able to hand express and spoon feed but baby "did not want my breast."  I reassured mother that, sometimes, when mother/baby are learning to breast feed it can be hard to latch.  However, I encouraged her to call for assistance the next time baby is ready to feed and I would help with latching.  I will review more breast feeding basics with her when she calls for assistance.    Encouraged to feed 8-12 times/24 hours or sooner if baby is showing feeding cues.  Colostrum container provided and milk storage times reviewed.  Finger feeding demonstrated.  Mother will be a "stay at home" mother after maternity leave.  She is not a [facility] participant.  Mother will call as needed for latching assistance.  She is willing to call.   Maternal Data Formula Feeding for Exclusion: Yes Reason for exclusion: Mother's choice to formula and breast feed on admission Has patient been taught Hand Expression?: Yes Does the patient have breastfeeding experience prior to this delivery?: No  Feeding Feeding Type: Bottle Fed - Formula Nipple Type: Slow - flow  LATCH Score                   Interventions    Lactation Tools Discussed/Used WIC Program: No   Consult Status Consult Status: Follow-up Date: 08/07/18 Follow-up type: In-patient    Noga Fogg R Weslee Fogg 08/06/2018, 10:03 AM

## 2018-08-06 NOTE — Progress Notes (Signed)
PPD #1 No problems, some cramping especially with breastfeeding Afeb, VSS Fundus firm, NT at U-1 Continue routine postpartum care

## 2018-08-07 LAB — RPR: RPR: NONREACTIVE

## 2018-08-07 MED ORDER — PRENATAL MULTIVITAMIN CH: 1.0000 | ORAL_TABLET | Freq: Every day | ORAL | 3 refills | Status: DC

## 2018-08-07 MED ORDER — IBUPROFEN 600 MG PO TABS: 600.0000 mg | ORAL_TABLET | Freq: Four times a day (QID) | ORAL | 1 refills | Status: DC | PRN

## 2018-08-07 NOTE — Discharge Summary (Signed)
OB Discharge Summary     Patient Name: Terri Simpson DOB: Jul 08, 1985 MRN: [id number]  Date of admission: 08/05/2018 Delivering MD: Huel Cote   Date of discharge: 08/07/2018  Admitting diagnosis: 37.2 WKS, WATER BROKE Intrauterine pregnancy: [redacted]w[redacted]d     Secondary diagnosis:  Active Problems:   Indication for care in labor and delivery, antepartum   NSVD (normal spontaneous vaginal delivery)  Additional problems: N/A     Discharge diagnosis: Term Pregnancy Delivered                                                                                                Post partum procedures:N/A  Augmentation: N/A  Complications: None  Hospital course:  Onset of Labor With Vaginal Delivery     33 y.o. yo [id number] at [redacted]w[redacted]d was admitted in Active Labor on 08/05/2018. Patient had an uncomplicated labor course as follows:  Membrane Rupture Time/Date: 12:30 PM ,08/05/2018   Intrapartum Procedures: Episiotomy: None [1]                                         Lacerations:  2nd degree [3]  Patient had a delivery of a Viable infant. 08/05/2018  Information for the patient's newborn:  Cynai, Valera [[id number]]       Pateint had an uncomplicated postpartum course.  She is ambulating, tolerating a regular diet, passing flatus, and urinating well. Patient is discharged home in stable condition on 08/07/18.   Physical exam  Vitals:   08/06/18 1330 08/06/18 1710 08/06/18 2124 08/07/18 0551  BP: 104/70  106/66 (!) 93/58  Pulse: 88  97 80  Resp: 20  18 18   Temp:  97.8 F (36.6 C) 98 F (36.7 C) 98 F (36.7 C)  TempSrc:  Oral Oral Oral  SpO2: 97%     Weight:      Height:       General: alert and no distress Lochia: appropriate Uterine Fundus: firm  Labs: Lab Results  Component Value Date   WBC 9.1 08/06/2018   HGB 9.6 (L) 08/06/2018   HCT 29.2 (L) 08/06/2018   MCV 84.9 08/06/2018   PLT 117 (L) 08/06/2018   CMP Latest Ref Rng & Units 11/09/2015  Glucose 65 -  99 mg/dL 361(Q)  BUN 6 - 20 mg/dL 14  Creatinine 2.44 - 9.75 mg/dL 3.00  Sodium 511 - 021 mmol/L 136  Potassium 3.5 - 5.1 mmol/L 3.9  Chloride 101 - 111 mmol/L 106  CO2 22 - 32 mmol/L 20(L)  Calcium 8.9 - 10.3 mg/dL 9.3  Total Protein 6.5 - 8.1 g/dL 8.2(H)  Total Bilirubin 0.3 - 1.2 mg/dL 0.6  Alkaline Phos 38 - 126 U/L 73  AST 15 - 41 U/L 23  ALT 14 - 54 U/L 26    Discharge instruction: per After Visit Summary and "Baby and Me Booklet".  After visit meds:  Allergies as of 08/07/2018   No Known Allergies     Medication List    STOP taking these  medications   atorvastatin 10 MG tablet Commonly known as:  LIPITOR     TAKE these medications   erythromycin ophthalmic ointment Place a 1/2 inch ribbon of ointment along the upper and lower lids qhs and massage in.   FISH OIL PO Take 1 capsule by mouth daily.   ibuprofen 600 MG tablet Commonly known as:  ADVIL,MOTRIN Take 1 tablet (600 mg total) by mouth every 6 (six) hours as needed.   ondansetron 4 MG tablet Commonly known as:  ZOFRAN Take 1 tablet (4 mg total) by mouth every 8 (eight) hours as needed for nausea or vomiting.   prenatal multivitamin Tabs tablet Take 1 tablet by mouth daily at 12 noon.       Diet: routine diet  Activity: Advance as tolerated. Pelvic rest for 6 weeks.   Outpatient follow up:6 weeks Follow up Appt:No future appointments. Follow up Visit:No follow-ups on file.  Postpartum contraception: Undecided  Newborn Data: Live born female  Birth Weight: 6 lb 5.8 oz (2885 g) APGAR: 8, 9  Newborn Delivery   Birth date/time:  08/05/2018 21:14:00 Delivery type:  Vaginal, Spontaneous     Baby Feeding: Bottle Disposition:home with mother   08/07/2018 Sherian Rein, MD

## 2018-08-07 NOTE — Progress Notes (Signed)
Post Partum Day 2 Subjective: no complaints, up ad lib, voiding, tolerating PO and nl lochia, pain controled  Objective: Blood pressure (!) 93/58, pulse 80, temperature 98 F (36.7 C), temperature source Oral, resp. rate 18, height 5' (1.524 m), weight 89.1 kg, last menstrual period 11/17/2017, SpO2 97 %, unknown if currently breastfeeding.  Physical Exam:  General: alert and no distress Lochia: appropriate Uterine Fundus: firm  Recent Labs    08/05/18 1431 08/06/18 0552  HGB 12.0 9.6*  HCT 37.5 29.2*    Assessment/Plan: Discharge home, Breastfeeding and Lactation consult.  Routine PP care.  D/c with Motrin and PNV.  F/u 6 weeks   LOS: 2 days   Kinsly Hild Bovard-Stuckert 08/07/2018, 8:32 AM

## 2019-04-15 DIAGNOSIS — L209 Atopic dermatitis, unspecified: Secondary | ICD-10-CM | POA: Insufficient documentation

## 2019-04-15 DIAGNOSIS — H9192 Unspecified hearing loss, left ear: Secondary | ICD-10-CM | POA: Insufficient documentation

## 2019-09-13 ENCOUNTER — Other Ambulatory Visit: Payer: Self-pay | Admitting: Physician Assistant

## 2019-09-13 DIAGNOSIS — R102 Pelvic and perineal pain: Secondary | ICD-10-CM

## 2020-06-03 ENCOUNTER — Ambulatory Visit
Admission: RE | Admit: 2020-06-03 | Discharge: 2020-06-03 | Disposition: A | Discharge status: Home / Self Care | Payer: Medicaid Other | Source: Ambulatory Visit | Attending: Internal Medicine | Admitting: Internal Medicine

## 2020-06-03 ENCOUNTER — Other Ambulatory Visit: Payer: Self-pay | Admitting: Internal Medicine

## 2020-06-03 ENCOUNTER — Other Ambulatory Visit: Payer: Self-pay

## 2020-06-03 DIAGNOSIS — M25562 Pain in left knee: Secondary | ICD-10-CM

## 2020-06-08 ENCOUNTER — Emergency Department (HOSPITAL_COMMUNITY)
Admission: EM | Admit: 2020-06-08 | Discharge: 2020-06-08 | Disposition: A | Discharge status: Home / Self Care | Payer: Medicaid Other | Attending: Emergency Medicine | Admitting: Emergency Medicine

## 2020-06-08 ENCOUNTER — Other Ambulatory Visit: Payer: Self-pay

## 2020-06-08 ENCOUNTER — Encounter (HOSPITAL_COMMUNITY): Payer: Self-pay

## 2020-06-08 DIAGNOSIS — R0602 Shortness of breath: Secondary | ICD-10-CM | POA: Insufficient documentation

## 2020-06-08 DIAGNOSIS — R52 Pain, unspecified: Secondary | ICD-10-CM | POA: Diagnosis present

## 2020-06-08 DIAGNOSIS — R000 Tachycardia, unspecified: Secondary | ICD-10-CM | POA: Diagnosis not present

## 2020-06-08 DIAGNOSIS — Z5321 Procedure and treatment not carried out due to patient leaving prior to being seen by health care provider: Secondary | ICD-10-CM | POA: Diagnosis not present

## 2020-06-08 LAB — BASIC METABOLIC PANEL
Anion gap: 9 (ref 5–15)
BUN: 9 mg/dL (ref 6–20)
CO2: 25 mmol/L (ref 22–32)
Calcium: 8.7 mg/dL — ABNORMAL LOW (ref 8.9–10.3)
Chloride: 101 mmol/L (ref 98–111)
Creatinine, Ser: 0.49 mg/dL (ref 0.44–1.00)
GFR, Estimated: >60 mL/min (ref >60)
Glucose, Bld: 94 mg/dL (ref 70–99)
Potassium: 3.7 mmol/L (ref 3.5–5.1)
Sodium: 135 mmol/L (ref 135–145)

## 2020-06-08 LAB — CBC
HCT: 43.3 % (ref 36.0–46.0)
Hemoglobin: 14 g/dL (ref 12.0–15.0)
MCH: 28.8 pg (ref 26.0–34.0)
MCHC: 32.3 g/dL (ref 30.0–36.0)
MCV: 89.1 fL (ref 80.0–100.0)
Platelets: 121 10*3/uL — ABNORMAL LOW (ref 150–400)
RBC: 4.86 MIL/uL (ref 3.87–5.11)
RDW: 13.2 % (ref 11.5–15.5)
WBC: 5 10*3/uL (ref 4.0–10.5)
nRBC: 0 % (ref 0.0–0.2)

## 2020-06-08 LAB — I-STAT BETA HCG BLOOD, ED (MC, WL, AP ONLY): I-stat hCG, quantitative: <5 m[IU]/mL (ref <5)

## 2020-06-08 NOTE — ED Triage Notes (Signed)
Pt reports since getting her booster shot yesterday she has had generalized body aches all over and feels like it is harder for her to breathe. Resp e.u at this time, tachycardic in triage 120s. Pt a.o, nad noted

## 2020-06-08 NOTE — ED Notes (Signed)
Pt called multiple times, no answer 

## 2021-03-18 ENCOUNTER — Other Ambulatory Visit: Payer: Self-pay

## 2021-03-18 ENCOUNTER — Ambulatory Visit (HOSPITAL_COMMUNITY)
Admission: EM | Admit: 2021-03-18 | Discharge: 2021-03-18 | Disposition: A | Discharge status: Home / Self Care | Payer: Medicaid Other

## 2021-03-18 ENCOUNTER — Ambulatory Visit (INDEPENDENT_AMBULATORY_CARE_PROVIDER_SITE_OTHER): Payer: Medicaid Other

## 2021-03-18 ENCOUNTER — Encounter (HOSPITAL_COMMUNITY): Payer: Self-pay

## 2021-03-18 DIAGNOSIS — S9031XA Contusion of right foot, initial encounter: Secondary | ICD-10-CM

## 2021-03-18 DIAGNOSIS — M79671 Pain in right foot: Secondary | ICD-10-CM | POA: Diagnosis not present

## 2021-03-18 DIAGNOSIS — S90121A Contusion of right lesser toe(s) without damage to nail, initial encounter: Secondary | ICD-10-CM | POA: Diagnosis not present

## 2021-03-18 MED ORDER — NAPROXEN 500 MG PO TABS: 500.0000 mg | ORAL_TABLET | Freq: Two times a day (BID) | ORAL | 0 refills | Status: DC

## 2021-03-18 NOTE — ED Triage Notes (Signed)
Pt reporst painin the right foot x 1 day. Sattes she hit the right foot with the corner of the bed. States pain is worse in the right fourth toe.

## 2021-03-18 NOTE — ED Provider Notes (Signed)
[facility]   MRN: [id number] DOB: 1985/06/10  Subjective:   Terri Simpson is a 35 y.o. female presenting for 1 day history of acute onset persistent and worsening right foot pain worse over the fourth toe.  Patient stubbed her foot against the wooden part of her bed frame.  She has since had persistent severe pain with bruising and swelling.  Has had difficulty walking.  Pain radiates up into her right lower leg.  Has been using Aleve with minimal relief.   Current Facility-Administered Medications:    ketotifen (ZADITOR) 0.025 % ophthalmic solution 1 drop, 1 drop, Left Eye, BID, McClung, Angela M, PA-C  Current Outpatient Medications:    atorvastatin (LIPITOR) 10 MG tablet, atorvastatin 10 mg tablet  TAKE 1 TABLET BY MOUTH ONCE DAILY IN THE EVENING, Disp: , Rfl:    levonorgestrel (MIRENA) 20 MCG/DAY IUD, 1 each by Intrauterine route once., Disp: , Rfl:    erythromycin ophthalmic ointment, Place a 1/2 inch ribbon of ointment along the upper and lower lids qhs and massage in., Disp: 3.5 g, Rfl: 0   ibuprofen (ADVIL,MOTRIN) 600 MG tablet, Take 1 tablet (600 mg total) by mouth every 6 (six) hours as needed., Disp: 45 tablet, Rfl: 1   montelukast (SINGULAIR) 10 MG tablet, montelukast 10 mg tablet  Take 1 tablet every day by oral route for 30 days., Disp: , Rfl:    Omega-3 Fatty Acids (FISH OIL PO), Take 1 capsule by mouth daily., Disp: , Rfl:    ondansetron (ZOFRAN) 4 MG tablet, Take 1 tablet (4 mg total) by mouth every 8 (eight) hours as needed for nausea or vomiting., Disp: 20 tablet, Rfl: 0   Prenatal Vit-Fe Fumarate-FA (PRENATAL MULTIVITAMIN) TABS tablet, Take 1 tablet by mouth daily at 12 noon., Disp: 100 tablet, Rfl: 3   No Known Allergies  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  Social History   Tobacco Use   Smoking status: Never   Smokeless tobacco: Never  Vaping Use   Vaping Use: Never used  Substance Use Topics   Alcohol use: No   Drug use: No    ROS   Objective:   Vitals: BP 112/77 (BP Location: Right Arm)   Pulse 73   Temp 97.7 F (36.5 C) (Oral)   Resp 18   SpO2 98%   Breastfeeding No   Physical Exam Constitutional:      General: She is not in acute distress.    Appearance: Normal appearance. She is well-developed. She is not ill-appearing, toxic-appearing or diaphoretic.  HENT:     Head: Normocephalic and atraumatic.     Nose: Nose normal.     Mouth/Throat:     Mouth: Mucous membranes are moist.     Pharynx: Oropharynx is clear.  Eyes:     General: No scleral icterus.       Right eye: No discharge.        Left eye: No discharge.     Extraocular Movements: Extraocular movements intact.     Conjunctiva/sclera: Conjunctivae normal.  Pupils: Pupils are equal, round, and reactive to light.  Cardiovascular:     Rate and Rhythm: Normal rate.  Pulmonary:     Effort: Pulmonary effort is normal.  Musculoskeletal:       Feet:  Skin:    General: Skin is warm and dry.  Neurological:     General: No focal deficit present.     Mental Status: She is alert and oriented to person, place, and time.  Psychiatric:        Mood and Affect: Mood normal.        Behavior: Behavior normal.        Thought Content: Thought content normal.        Judgment: Judgment normal.    No obvious fracture.    Assessment and Plan :   PDMP not reviewed this encounter.  1. Contusion of lesser toe of right foot without damage to nail, initial encounter   2. Traumatic ecchymosis of toe of right foot, initial encounter   3. Contusion of right foot,  initial encounter    Will manage for contusion with naproxen, recommended a post-op shoe. X-ray over-read was pending at time of discharge, recommended follow up with only abnormal results. Otherwise will not call for negative over-read. Patient was in agreement.  Counseled patient on potential for adverse effects with medications prescribed/recommended today, ER and return-to-clinic precautions discussed, patient verbalized understanding.    Wallis Bamberg, PA-C 03/18/21 [id number]

## 2021-03-26 DIAGNOSIS — G43009 Migraine without aura, not intractable, without status migrainosus: Secondary | ICD-10-CM | POA: Insufficient documentation

## 2021-03-30 ENCOUNTER — Ambulatory Visit (INDEPENDENT_AMBULATORY_CARE_PROVIDER_SITE_OTHER): Payer: Medicaid Other

## 2021-03-30 ENCOUNTER — Encounter: Payer: Self-pay | Admitting: Podiatry

## 2021-03-30 ENCOUNTER — Ambulatory Visit: Payer: Medicaid Other | Admitting: Podiatry

## 2021-03-30 ENCOUNTER — Other Ambulatory Visit: Payer: Self-pay

## 2021-03-30 DIAGNOSIS — S99921A Unspecified injury of right foot, initial encounter: Secondary | ICD-10-CM

## 2021-03-30 DIAGNOSIS — J309 Allergic rhinitis, unspecified: Secondary | ICD-10-CM | POA: Insufficient documentation

## 2021-03-30 DIAGNOSIS — S92504A Nondisplaced unspecified fracture of right lesser toe(s), initial encounter for closed fracture: Secondary | ICD-10-CM | POA: Diagnosis not present

## 2021-03-30 DIAGNOSIS — H90A32 Mixed conductive and sensorineural hearing loss, unilateral, left ear with restricted hearing on the contralateral side: Secondary | ICD-10-CM | POA: Insufficient documentation

## 2021-03-30 DIAGNOSIS — H1045 Other chronic allergic conjunctivitis: Secondary | ICD-10-CM | POA: Insufficient documentation

## 2021-03-30 DIAGNOSIS — J301 Allergic rhinitis due to pollen: Secondary | ICD-10-CM | POA: Insufficient documentation

## 2021-03-30 DIAGNOSIS — J3081 Allergic rhinitis due to animal (cat) (dog) hair and dander: Secondary | ICD-10-CM | POA: Insufficient documentation

## 2021-04-02 NOTE — Progress Notes (Signed)
Subjective:   Patient ID: Rowe Pavy, female   DOB: 35 y.o.   MRN: [id number]   HPI 35 year old female presents the office today with concerns of an injury to the right fourth toe which occurred about 2 weeks ago.  She states that she saw her primary care physician she had x-rays performed.  Patient did not feel was broken but x-rays were negative.  She has been in a surgical shoe.  She still has some tenderness but overall has gotten better.  No other injuries or concerns today.   Review of Systems  All other systems reviewed and are negative.  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   impaired hearing    Past Surgical History:  Procedure Laterality Date   STAPEDECTOMY Left    STAPEDECTOMY Right 04/28/2014   Procedure: RIGHT STAPEDECTOMY;  Surgeon: Serena Colonel, MD;  Location: [facility];  Service: ENT;  Laterality: Right;     Current Outpatient Medications:    Multiple Vitamin (MULTI-VITAMIN) tablet, Take 1 tablet by mouth daily., Disp: , Rfl:   Current Facility-Administered Medications:    ketotifen (ZADITOR) 0.025 % ophthalmic solution 1 drop, 1 drop, Left Eye, BID, McClung, Angela M, PA-C  No Known Allergies        Objective:  Physical Exam  General: AAO x3, NAD  Dermatological: Skin is warm, dry and supple bilateral.  There are no open sores, no preulcerative lesions, no rash or signs of infection present.  Vascular: Dorsalis Pedis artery and Posterior Tibial artery pedal pulses are 2/4 bilateral with immedate capillary fill time.  There is no pain with calf compression, swelling, warmth, erythema.   Neruologic: Grossly intact via light touch bilateral.   Musculoskeletal: There is tenderness palpation to right fourth toe with minimal swelling.  There is no open lesions.  No pain to the metatarsal or other digits.  Muscular strength 5/5 in all groups tested  bilateral.  Gait: Unassisted, Nonantalgic.       Assessment:   35 year old female right fourth toe injury     Plan:  -Treatment options discussed including all alternatives, risks, and complications -Etiology of symptoms were discussed -X-rays obtained and reviewed.  There is some edema present in the fourth proximal phalanx. -I would recommend continue with surgical shoe for now.  Discussed and she feels better she can transition to regular shoe as tolerated and I discussed buddy splinting to help with this.  Vivi Barrack DPM

## 2021-04-27 ENCOUNTER — Ambulatory Visit: Payer: Medicaid Other | Admitting: Podiatry

## 2021-07-05 ENCOUNTER — Encounter (HOSPITAL_COMMUNITY): Payer: Self-pay

## 2021-07-05 ENCOUNTER — Emergency Department (HOSPITAL_COMMUNITY): Payer: Medicaid Other

## 2021-07-05 ENCOUNTER — Emergency Department (HOSPITAL_COMMUNITY)
Admission: EM | Admit: 2021-07-05 | Discharge: 2021-07-06 | Disposition: A | Discharge status: Home / Self Care | Payer: Medicaid Other | Attending: Emergency Medicine | Admitting: Emergency Medicine

## 2021-07-05 DIAGNOSIS — N939 Abnormal uterine and vaginal bleeding, unspecified: Secondary | ICD-10-CM | POA: Diagnosis not present

## 2021-07-05 DIAGNOSIS — R1030 Lower abdominal pain, unspecified: Secondary | ICD-10-CM | POA: Diagnosis present

## 2021-07-05 DIAGNOSIS — Z5321 Procedure and treatment not carried out due to patient leaving prior to being seen by health care provider: Secondary | ICD-10-CM | POA: Diagnosis not present

## 2021-07-05 LAB — BASIC METABOLIC PANEL
Anion gap: 7 (ref 5–15)
BUN: 11 mg/dL (ref 6–20)
CO2: 26 mmol/L (ref 22–32)
Calcium: 9.1 mg/dL (ref 8.9–10.3)
Chloride: 104 mmol/L (ref 98–111)
Creatinine, Ser: 0.68 mg/dL (ref 0.44–1.00)
GFR, Estimated: >60 mL/min (ref >60)
Glucose, Bld: 99 mg/dL (ref 70–99)
Potassium: 3.7 mmol/L (ref 3.5–5.1)
Sodium: 137 mmol/L (ref 135–145)

## 2021-07-05 LAB — CBC
HCT: 42.8 % (ref 36.0–46.0)
Hemoglobin: 14.2 g/dL (ref 12.0–15.0)
MCH: 29.2 pg (ref 26.0–34.0)
MCHC: 33.2 g/dL (ref 30.0–36.0)
MCV: 87.9 fL (ref 80.0–100.0)
Platelets: 157 10*3/uL (ref 150–400)
RBC: 4.87 MIL/uL (ref 3.87–5.11)
RDW: 13.1 % (ref 11.5–15.5)
WBC: 6.8 10*3/uL (ref 4.0–10.5)
nRBC: 0 % (ref 0.0–0.2)

## 2021-07-05 LAB — I-STAT BETA HCG BLOOD, ED (MC, WL, AP ONLY): I-stat hCG, quantitative: <5 m[IU]/mL (ref <5)

## 2021-07-05 NOTE — ED Provider Triage Note (Signed)
Emergency Medicine Provider Triage Evaluation Note  Terri Simpson , a 36 y.o. female  was evaluated in triage.  Pt complains of lower abdominal pain and vaginal bleeding.  Has mirena and feels like it may have moved.  Called OB today and sent patient in for [place].  Review of Systems  Positive: Pelvic pain, vaginal bleeding Negative: fever  Physical Exam  BP (!) 138/108    Pulse (!) 117    Temp 97.7 F (36.5 C) (Oral)    Resp 16    Ht 5' (1.524 m)    Wt 89 kg    SpO2 100%    BMI 38.32 kg/m  Gen:   Awake, no distress   Resp:  Normal effort  MSK:   Moves extremities without difficulty  Other:    Medical Decision Making  Medically screening exam initiated at 10:19 PM.  Appropriate orders placed.  Terri Simpson was informed that the remainder of the evaluation will be completed by another provider, this initial triage assessment does not replace that evaluation, and the importance of remaining in the ED until their evaluation is complete.  Pelvic pain, vaginal bleeding.  Pregnancy is negative.  H/H stable.  Will get pelvic US.   Garlon Hatchet, PA-C 07/05/21 2221

## 2021-07-05 NOTE — ED Triage Notes (Signed)
Pt arrives POV for eval of lower abd pain and vaginal bleeding x 3 days. Pt reports generalized body aches as well. Showed picture to this RN w/ long clots on pad.

## 2021-07-06 NOTE — ED Notes (Signed)
Called for patient no answer

## 2021-07-06 NOTE — ED Notes (Addendum)
Called for patient, no answer.

## 2021-07-26 ENCOUNTER — Other Ambulatory Visit: Payer: Self-pay | Admitting: Obstetrics and Gynecology

## 2021-07-26 DIAGNOSIS — N6310 Unspecified lump in the right breast, unspecified quadrant: Secondary | ICD-10-CM

## 2021-07-26 DIAGNOSIS — N6320 Unspecified lump in the left breast, unspecified quadrant: Secondary | ICD-10-CM

## 2021-08-19 ENCOUNTER — Other Ambulatory Visit: Payer: Medicaid Other

## 2021-10-04 ENCOUNTER — Other Ambulatory Visit: Payer: Medicaid Other

## 2021-10-20 ENCOUNTER — Ambulatory Visit
Admission: RE | Admit: 2021-10-20 | Discharge: 2021-10-20 | Disposition: A | Discharge status: Home / Self Care | Payer: Medicaid Other | Source: Ambulatory Visit | Attending: Obstetrics and Gynecology | Admitting: Obstetrics and Gynecology

## 2021-10-20 ENCOUNTER — Ambulatory Visit: Payer: Medicaid Other

## 2021-10-20 DIAGNOSIS — N6310 Unspecified lump in the right breast, unspecified quadrant: Secondary | ICD-10-CM

## 2021-10-20 DIAGNOSIS — N6320 Unspecified lump in the left breast, unspecified quadrant: Secondary | ICD-10-CM

## 2021-10-21 ENCOUNTER — Other Ambulatory Visit: Payer: Self-pay

## 2021-10-21 ENCOUNTER — Emergency Department (HOSPITAL_COMMUNITY): Payer: Medicaid Other

## 2021-10-21 ENCOUNTER — Emergency Department (HOSPITAL_COMMUNITY)
Admission: EM | Admit: 2021-10-21 | Discharge: 2021-10-21 | Disposition: A | Discharge status: Home / Self Care | Payer: Medicaid Other | Attending: Emergency Medicine | Admitting: Emergency Medicine

## 2021-10-21 DIAGNOSIS — R569 Unspecified convulsions: Secondary | ICD-10-CM | POA: Insufficient documentation

## 2021-10-21 DIAGNOSIS — R55 Syncope and collapse: Secondary | ICD-10-CM | POA: Diagnosis present

## 2021-10-21 LAB — CBC WITH DIFFERENTIAL/PLATELET
Abs Immature Granulocytes: 0.05 10*3/uL (ref 0.00–0.07)
Basophils Absolute: 0 10*3/uL (ref 0.0–0.1)
Basophils Relative: 0 %
Eosinophils Absolute: 0 10*3/uL (ref 0.0–0.5)
Eosinophils Relative: 0 %
HCT: 43.7 % (ref 36.0–46.0)
Hemoglobin: 14.4 g/dL (ref 12.0–15.0)
Immature Granulocytes: 1 %
Lymphocytes Relative: 21 %
Lymphs Abs: 2.1 10*3/uL (ref 0.7–4.0)
MCH: 28.5 pg (ref 26.0–34.0)
MCHC: 33 g/dL (ref 30.0–36.0)
MCV: 86.5 fL (ref 80.0–100.0)
Monocytes Absolute: 0.5 10*3/uL (ref 0.1–1.0)
Monocytes Relative: 5 %
Neutro Abs: 7.6 10*3/uL (ref 1.7–7.7)
Neutrophils Relative %: 73 %
Platelets: 189 10*3/uL (ref 150–400)
RBC: 5.05 MIL/uL (ref 3.87–5.11)
RDW: 12.9 % (ref 11.5–15.5)
WBC: 10.3 10*3/uL (ref 4.0–10.5)
nRBC: 0 % (ref 0.0–0.2)

## 2021-10-21 LAB — COMPREHENSIVE METABOLIC PANEL
ALT: 31 U/L (ref 0–44)
AST: 26 U/L (ref 15–41)
Albumin: 4.3 g/dL (ref 3.5–5.0)
Alkaline Phosphatase: 58 U/L (ref 38–126)
Anion gap: 9 (ref 5–15)
BUN: 18 mg/dL (ref 6–20)
CO2: 22 mmol/L (ref 22–32)
Calcium: 9.2 mg/dL (ref 8.9–10.3)
Chloride: 106 mmol/L (ref 98–111)
Creatinine, Ser: 0.69 mg/dL (ref 0.44–1.00)
GFR, Estimated: >60 mL/min (ref >60)
Glucose, Bld: 104 mg/dL — ABNORMAL HIGH (ref 70–99)
Potassium: 3.5 mmol/L (ref 3.5–5.1)
Sodium: 137 mmol/L (ref 135–145)
Total Bilirubin: 0.7 mg/dL (ref 0.3–1.2)
Total Protein: 7.2 g/dL (ref 6.5–8.1)

## 2021-10-21 LAB — CBG MONITORING, ED: Glucose-Capillary: 108 mg/dL — ABNORMAL HIGH (ref 70–99)

## 2021-10-21 LAB — HCG, QUANTITATIVE, PREGNANCY: hCG, Beta Chain, Quant, S: 1 m[IU]/mL (ref <5)

## 2021-10-21 MED ORDER — LACTATED RINGERS IV BOLUS
1000.0000 mL | Freq: Once | INTRAVENOUS | Status: AC
Start: 2021-10-21 — End: 2021-10-21
  Administered 2021-10-21: 1000 mL via INTRAVENOUS

## 2021-10-21 NOTE — Discharge Instructions (Signed)
I am not sure if you had a seizure or just lost consciousness for other causes. I have put in a referral for neurology but you should also follow with your primary doctor as well.

## 2021-10-21 NOTE — ED Notes (Signed)
Pt able to ambulate int the hall with a steady gait and no assistance

## 2021-10-21 NOTE — ED Notes (Signed)
Patient transported to CT 

## 2021-10-21 NOTE — ED Triage Notes (Signed)
Pt BIB EMS from home for a syncapal episode. Pt was dizzy with blurred vision prior to syncopal episode. Pt stated she was under high emotional stress directly beofre the episode.   128/92 112 HR  98 RA  CBG 96

## 2021-10-21 NOTE — ED Provider Notes (Signed)
[facility] EMERGENCY DEPARTMENT Provider Note   CSN: [id number] Arrival date & time: 10/21/21  0202     History  Chief Complaint  Patient presents with   Loss of Consciousness    Terri Simpson is a 36 y.o. female.  65 yo F here with husband and family friend. History obtained from them and EMS and combined to the best of my ability.  Patient has been healthy in her normal state of health without any recent illnesses recently.  There may have been some type of anxiety or stress related event while the patient was folding laundry and then the husband heard her fall and went to check on her she was laying on the ground directly on her back.  EMS was called and apparently she told them that she was dizzy and had blurry vision prior to passing out as well.  Nursing after putting the nasal cannula here she felt a lot better even though no oxygen was being administered.  C-collar placed by EMS.  Patient states all over body soreness but no specific pain in one spot or another.   Loss of Consciousness     Home Medications Prior to Admission medications   Medication Sig Start Date End Date Taking? Authorizing Provider  acetaminophen (TYLENOL) 500 MG tablet Take 1,000 mg by mouth every 6 (six) hours as needed for moderate pain or headache.   Yes [provider]  Ascorbic Acid (VITAMIN C) 1000 MG tablet Take 1,000 mg by mouth daily.   Yes [provider]  atorvastatin (LIPITOR) 40 MG tablet Take 40 mg by mouth daily. 04/06/21  Yes [provider]  cholecalciferol (VITAMIN D) 25 MCG (1000 UNIT) tablet Take 1,000 Units by mouth daily.   Yes [provider]  Homeopathic Products (ALLERGY MEDICINE PO) Take 1 tablet by mouth daily as needed (allergies).   Yes [provider]  ibuprofen (ADVIL) 200 MG tablet Take 400 mg by mouth every 6 (six) hours as needed for headache or moderate pain.   Yes [provider]  Multiple  Vitamins-Minerals (HAIR SKIN AND NAILS FORMULA PO) Take 1 tablet by mouth daily.   Yes [provider]  vitamin B-12 (CYANOCOBALAMIN) 1000 MCG tablet Take 1,000 mcg by mouth daily.   Yes [provider]      Allergies    Patient has no known allergies.    Review of Systems   Review of Systems  Cardiovascular:  Positive for syncope.   Physical Exam Updated Vital Signs BP 97/61   Pulse 80   Temp 98.1 F (36.7 C) (Oral)   Resp 17   SpO2 99%  Physical Exam Vitals and nursing note reviewed.  Constitutional:      Appearance: She is well-developed.  HENT:     Head: Normocephalic and atraumatic.  Eyes:     Pupils: Pupils are equal, round, and reactive to light.  Cardiovascular:     Rate and Rhythm: Normal rate and regular rhythm.  Pulmonary:     Effort: No respiratory distress.     Breath sounds: No stridor.  Abdominal:     General: Abdomen is flat. There is no distension.  Musculoskeletal:     Cervical back: Normal range of motion.     Comments: No cervical spine tenderness, thoracic spine tenderness or Lumbar spine tenderness.  No tenderness or pain with palpation and full ROM of all joints in upper and lower extremities.  No ecchymosis or other signs of trauma on  back or extremities.  No Pain with AP or lateral compression of ribs.  No Paracervical ttp, paraspinal ttp   Skin:    General: Skin is warm and dry.  Neurological:     General: No focal deficit present.     Mental Status: She is alert.    ED Results / Procedures / Treatments   Labs (all labs ordered are listed, but only abnormal results are displayed) Labs Reviewed  COMPREHENSIVE METABOLIC PANEL - Abnormal; Notable for the following components:      Result Value   Glucose, Bld 104 (*)    All other components within normal limits  CBG MONITORING, ED - Abnormal; Notable for the following components:   Glucose-Capillary 108 (*)    All other components within normal limits  CBC WITH  DIFFERENTIAL/PLATELET  HCG, QUANTITATIVE, PREGNANCY    EKG EKG Interpretation  Date/Time:  Thursday Oct 21 2021 02:05:35 EDT Ventricular Rate:  118 PR Interval:  162 QRS Duration: 71 QT Interval:  373 QTC Calculation: 523 R Axis:   38 Text Interpretation: Sinus tachycardia Consider right atrial enlargement Low voltage, precordial leads Borderline T abnormalities, diffuse leads Prolonged QT interval Confirmed by Marily MemosMesner, Edia Pursifull [phone]) on 10/21/2021 2:08:58 AM  Radiology CT Head Wo Contrast  Result Date: 10/21/2021 CLINICAL DATA:  Head trauma, abnormal mental status (Age 35-64y); Neck trauma, dangerous injury mechanism (Age 35-64y). Syncope. EXAM: CT HEAD WITHOUT CONTRAST CT CERVICAL SPINE WITHOUT CONTRAST TECHNIQUE: Multidetector CT imaging of the head and cervical spine was performed following the standard protocol without intravenous contrast. Multiplanar CT image reconstructions of the cervical spine were also generated. RADIATION DOSE REDUCTION: This exam was performed according to the departmental dose-optimization program which includes automated exposure control, adjustment of the mA and/or kV according to patient size and/or use of iterative reconstruction technique. COMPARISON:  None Available. FINDINGS: CT HEAD FINDINGS Brain: Normal anatomic configuration. No abnormal intra or extra-axial mass lesion or fluid collection. No abnormal mass effect or midline shift. No evidence of acute intracranial hemorrhage or infarct. Ventricular size is normal. Cerebellum unremarkable. Vascular: Unremarkable Skull: Intact Sinuses/Orbits: Paranasal sinuses are clear. Orbits are unremarkable. Other: Mastoid air cells and middle ear cavities are clear. CT CERVICAL SPINE FINDINGS Alignment: Normal. Skull base and vertebrae: No acute fracture. No primary bone lesion or focal pathologic process. Soft tissues and spinal canal: No prevertebral fluid or swelling. No visible canal hematoma. Disc levels:  Intervertebral disc spaces are preserved. Prevertebral soft tissues are not thickened. Spinal canal is widely patent. No significant neuroforaminal narrowing Upper chest: Unremarkable Other: None IMPRESSION: No acute intracranial abnormality.  No calvarial fracture. No acute fracture or listhesis of the cervical spine. Electronically Signed   By: Helyn NumbersAshesh  Parikh M.D.   On: 10/21/2021 03:35   CT Cervical Spine Wo Contrast  Result Date: 10/21/2021 CLINICAL DATA:  Head trauma, abnormal mental status (Age 518-64y); Neck trauma, dangerous injury mechanism (Age 35-64y). Syncope. EXAM: CT HEAD WITHOUT CONTRAST CT CERVICAL SPINE WITHOUT CONTRAST TECHNIQUE: Multidetector CT imaging of the head and cervical spine was performed following the standard protocol without intravenous contrast. Multiplanar CT image reconstructions of the cervical spine were also generated. RADIATION DOSE REDUCTION: This exam was performed according to the departmental dose-optimization program which includes automated exposure control, adjustment of the mA and/or kV according to patient size and/or use of iterative reconstruction technique. COMPARISON:  None Available. FINDINGS: CT HEAD FINDINGS Brain: Normal anatomic configuration. No abnormal intra or extra-axial mass lesion or fluid collection. No abnormal mass  effect or midline shift. No evidence of acute intracranial hemorrhage or infarct. Ventricular size is normal. Cerebellum unremarkable. Vascular: Unremarkable Skull: Intact Sinuses/Orbits: Paranasal sinuses are clear. Orbits are unremarkable. Other: Mastoid air cells and middle ear cavities are clear. CT CERVICAL SPINE FINDINGS Alignment: Normal. Skull base and vertebrae: No acute fracture. No primary bone lesion or focal pathologic process. Soft tissues and spinal canal: No prevertebral fluid or swelling. No visible canal hematoma. Disc levels: Intervertebral disc spaces are preserved. Prevertebral soft tissues are not thickened. Spinal  canal is widely patent. No significant neuroforaminal narrowing Upper chest: Unremarkable Other: None IMPRESSION: No acute intracranial abnormality.  No calvarial fracture. No acute fracture or listhesis of the cervical spine. Electronically Signed   By: Helyn Numbers M.D.   On: 10/21/2021 03:35   DG Chest Portable 1 View  Result Date: 10/21/2021 CLINICAL DATA:  Syncope. EXAM: PORTABLE CHEST 1 VIEW COMPARISON:  None Available. FINDINGS: The heart size and mediastinal contours are within normal limits. Both lungs are clear. The visualized skeletal structures are unremarkable. IMPRESSION: No active disease. Electronically Signed   By: Aram Candela M.D.   On: 10/21/2021 02:45   MM DIAG BREAST TOMO BILATERAL  Result Date: 10/20/2021 CLINICAL DATA:  36 year old female with intermittent, diffuse bilateral breast pain for 1 week. EXAM: DIGITAL DIAGNOSTIC BILATERAL MAMMOGRAM WITH TOMOSYNTHESIS AND CAD TECHNIQUE: Bilateral digital diagnostic mammography and breast tomosynthesis was performed. The images were evaluated with computer-aided detection. COMPARISON:  None available. ACR Breast Density Category c: The breast tissue is heterogeneously dense, which may obscure small masses. FINDINGS: There are no focal or suspicious mammographic findings within either breast. IMPRESSION: No mammographic evidence of malignancy in either breast. RECOMMENDATION: 1. Clinical follow-up recommended for the diffusely painful area of concern in the bilateral breasts. Any further workup should be based on clinical grounds. 2. Screening mammogram at age 54 unless there are persistent or intervening clinical concerns. (Code:SM-B-40A) I have discussed the findings and recommendations with the patient. If applicable, a reminder letter will be sent to the patient regarding the next appointment. BI-RADS CATEGORY  1: Negative. Electronically Signed   By: Sande Brothers M.D.   On: 10/20/2021 14:53    Procedures Procedures     Medications Ordered in ED Medications  lactated ringers bolus 1,000 mL (0 mLs Intravenous Stopped 10/21/21 0519)    ED Course/ Medical Decision Making/ A&P                           Medical Decision Making Amount and/or Complexity of Data Reviewed Labs: ordered. Radiology: ordered.   Unclear etiology but surely could be related to the stressful event she was experiencing. Will evaluate for more high risk causes.   Patient is more awake now and states that she is sore all over.  Her husband states that maybe she had some shaking episode.  This is more worrisome for possible seizure.  No history of the same.  Will refer to neurology.  No indication for antiepileptics at this time.  While she will follow-up with her PCP for possible syncope.   Final Clinical Impression(s) / ED Diagnoses Final diagnoses:  Seizure-like activity (HCC)    Rx / DC Orders ED Discharge Orders          Ordered    Ambulatory referral to Neurology       Comments: An appointment is requested in approximately: 8 weeks   10/21/21 0455  Eudell Mcphee, Barbara Cower, MD 10/21/21 [phone]

## 2024-03-25 IMAGING — CT CT HEAD W/O CM
4 series · 16 of 47 positions shown, 18 images · non-contrast
Comparison: None Available.

CLINICAL DATA: Head trauma, abnormal mental status (Age 18-64y);
Neck trauma, dangerous injury mechanism (Age 16-64y). Syncope.



[Series 3: head without · axial · non-contrast · 0.42mm/px · z∈[-152,-32]mm · 7 of 34 slices shown, 9 images]
[im 5/34  brain]
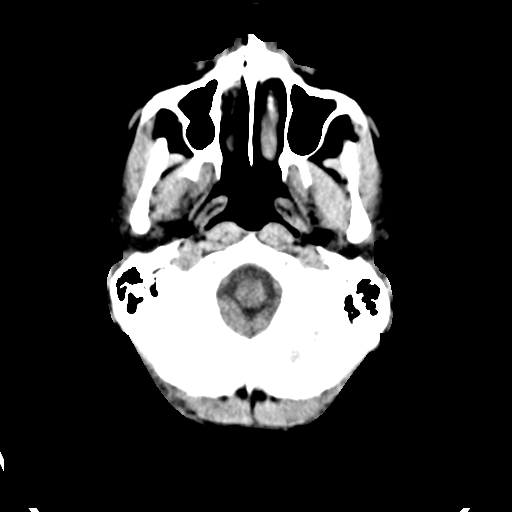
[im 5/34  bone]
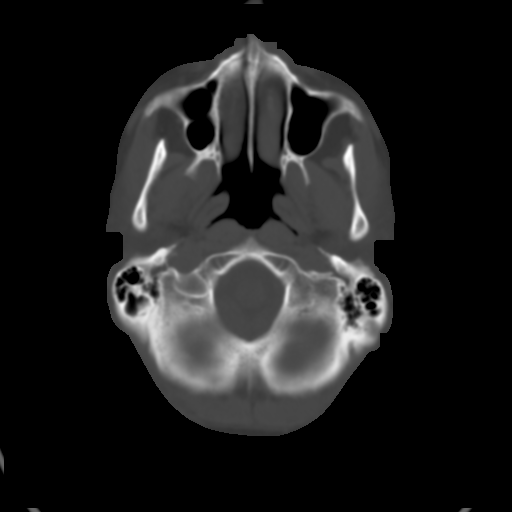
[im 9/34  brain]
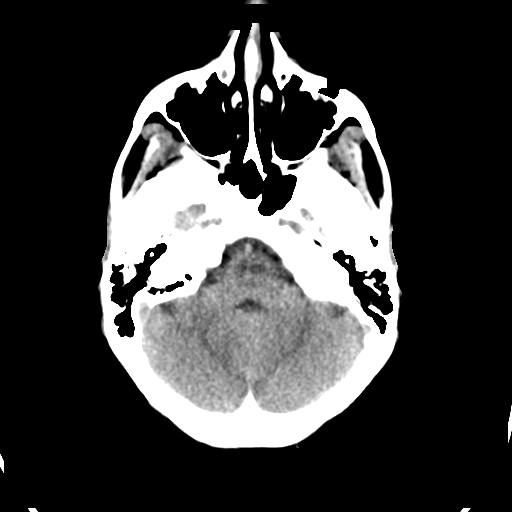
[im 13/34  brain]
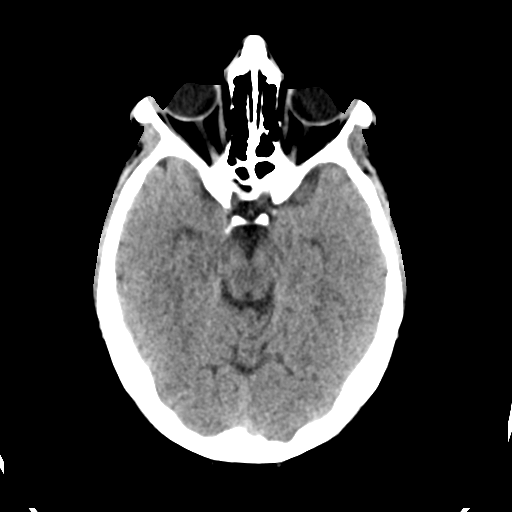
[im 17/34  brain]
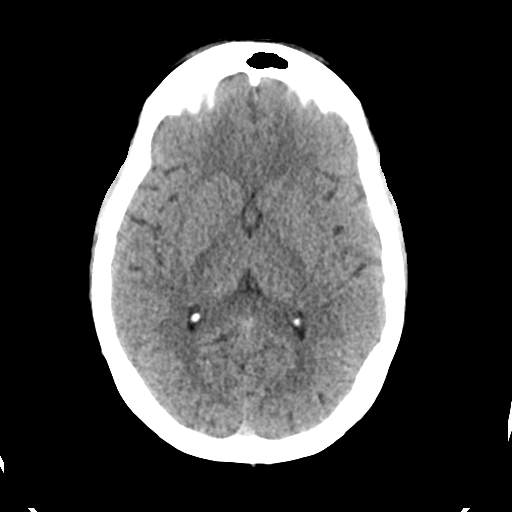
[im 21/34  brain]
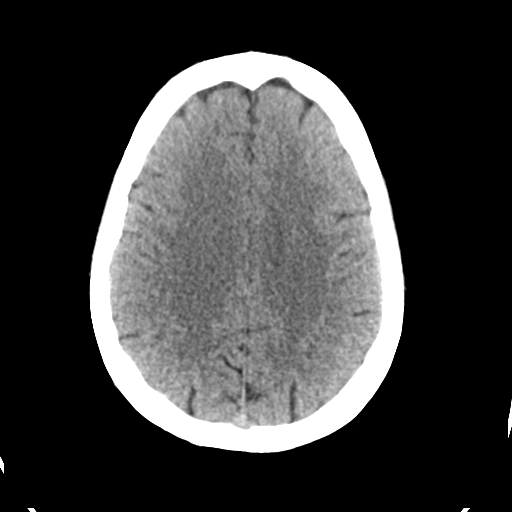
[im 21/34  bone]
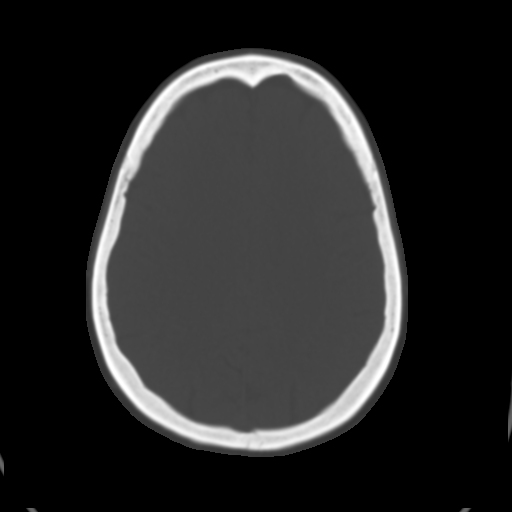
[im 25/34  brain]
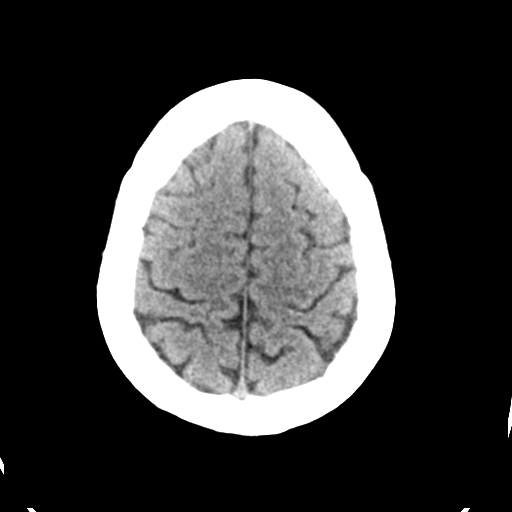
[im 29/34  brain]
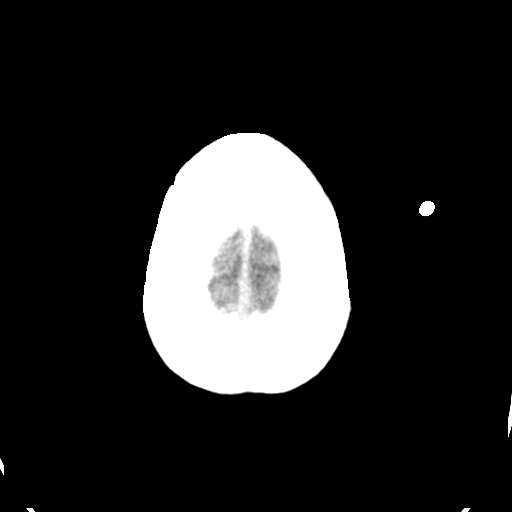

[Series 4: head bone · axial · 0.42mm/px · z∈[-156,-122]mm · 3 of 85 slices shown]
[im 9/85  bone]
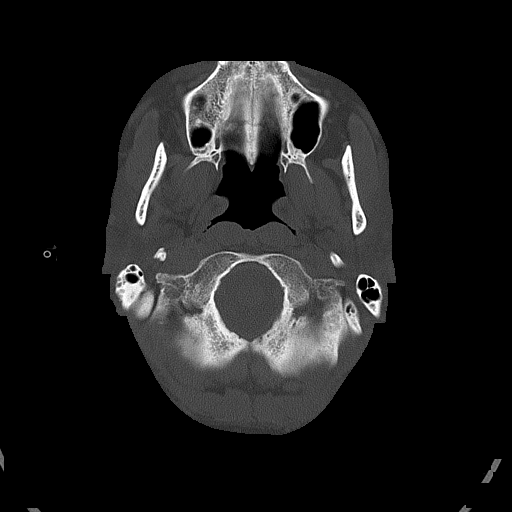
[im 17/85  bone]
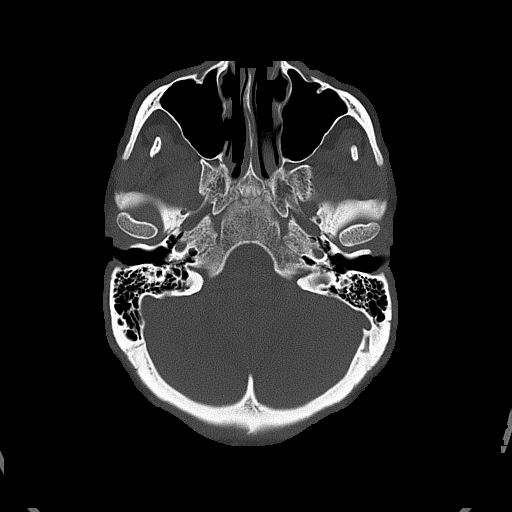
[im 26/85  bone]
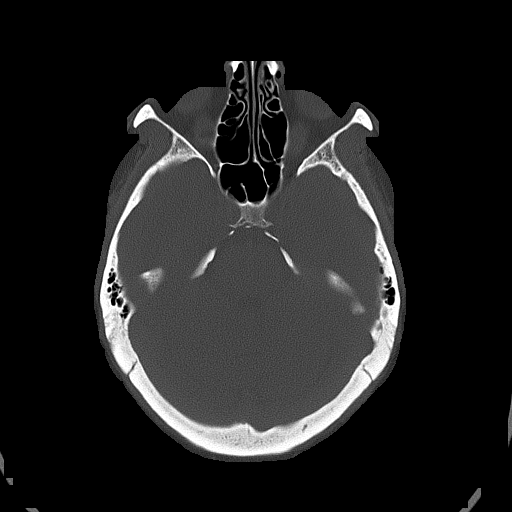

[Series 5: head without cor · coronal · non-contrast · 0.33mm/px · 3 of 67 slices shown]
[im 23/67  brain]
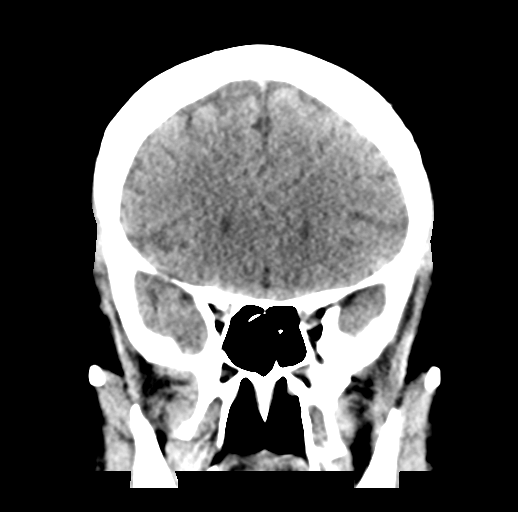
[im 30/67  brain]
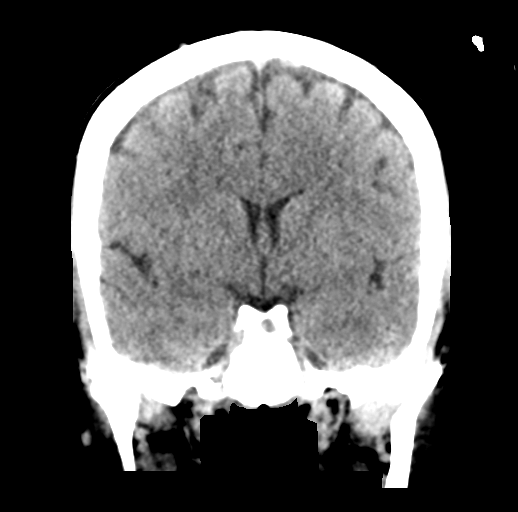
[im 37/67  brain]
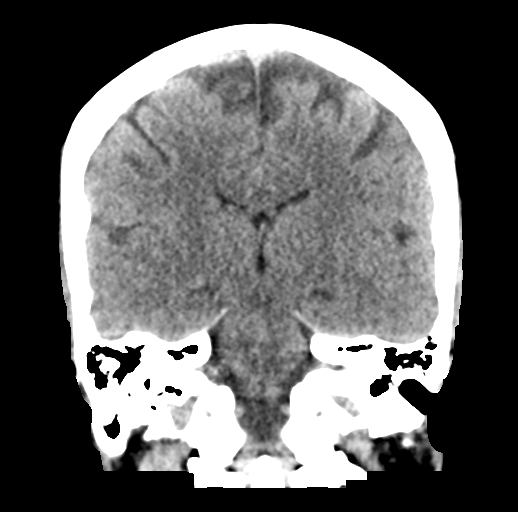

[Series 6: head without sag · sagittal · non-contrast · 0.33mm/px · 3 of 52 slices shown]
[im 18/52  brain]
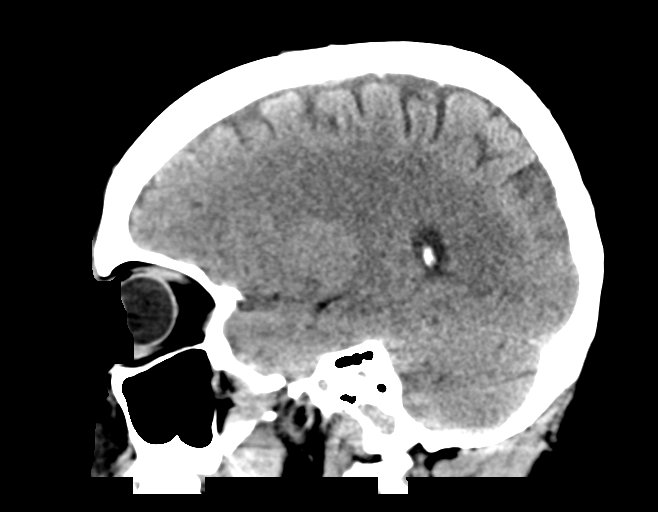
[im 26/52  brain]
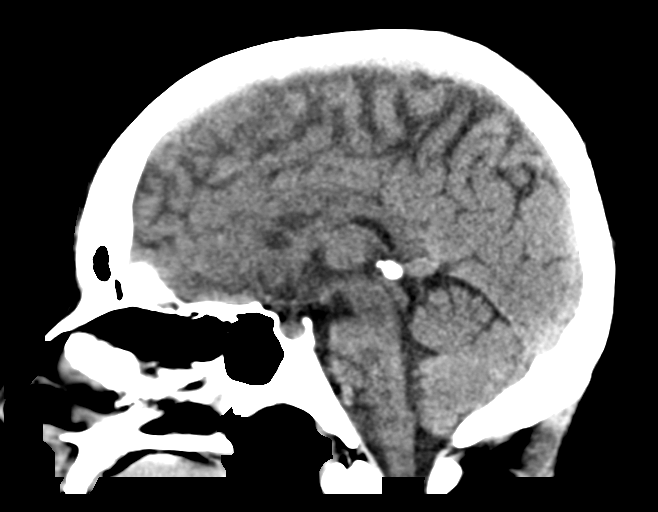
[im 35/52  brain]
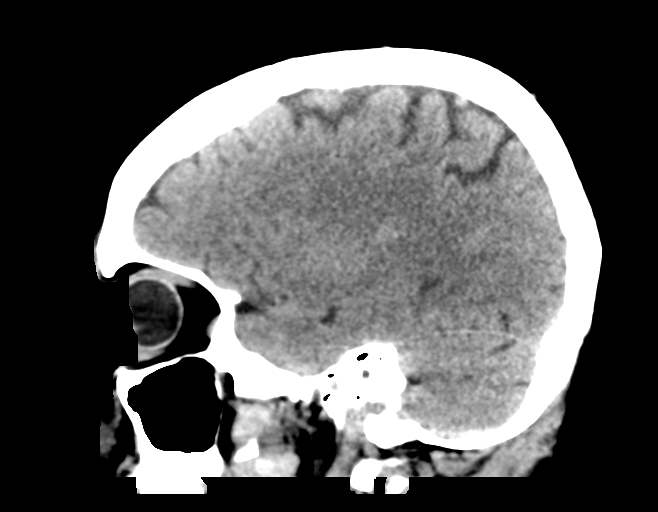

[16 of 47 positions shown; findings below may reference images not displayed]

FINDINGS: CT HEAD FINDINGS

Brain: Normal anatomic configuration. No abnormal intra or
extra-axial mass lesion or fluid collection. No abnormal mass effect
or midline shift. No evidence of acute intracranial hemorrhage or
infarct. Ventricular size is normal. Cerebellum unremarkable.

Vascular: Unremarkable

Skull: Intact

Sinuses/Orbits: Paranasal sinuses are clear. Orbits are
unremarkable.

Other: Mastoid air cells and middle ear cavities are clear.

CT CERVICAL SPINE FINDINGS

Alignment: Normal.

Skull base and vertebrae: No acute fracture. No primary bone lesion
or focal pathologic process.

Soft tissues and spinal canal: No prevertebral fluid or swelling. No
visible canal hematoma.

Disc levels: Intervertebral disc spaces are preserved. Prevertebral
soft tissues are not thickened. Spinal canal is widely patent. No
significant neuroforaminal narrowing

Upper chest: Unremarkable

Other: None
IMPRESSION: No acute intracranial abnormality.  No calvarial fracture.

No acute fracture or listhesis of the cervical spine.

## 2024-03-25 IMAGING — CT CT CERVICAL SPINE W/O CM
3 of 4 series · 12 of 33 positions shown, 14 images · non-contrast
Comparison: None Available.

CLINICAL DATA: Head trauma, abnormal mental status (Age 18-64y);
Neck trauma, dangerous injury mechanism (Age 16-64y). Syncope.



[Series 4: c_spine 2.0 st · axial · 0.34mm/px · z∈[-288,-160]mm · 4 of 97 slices shown, 5 images]
[im 17/97  soft-tissue]
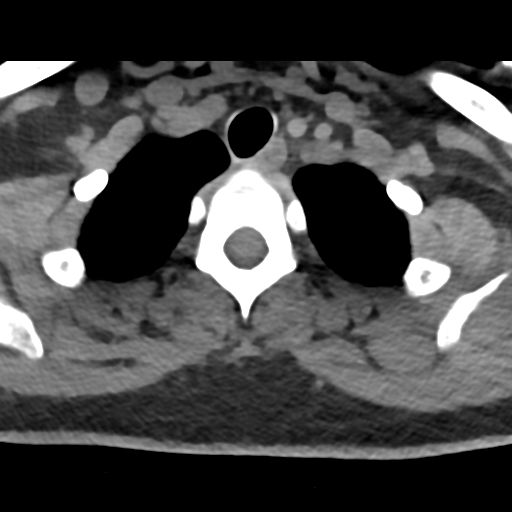
[im 17/97  bone]
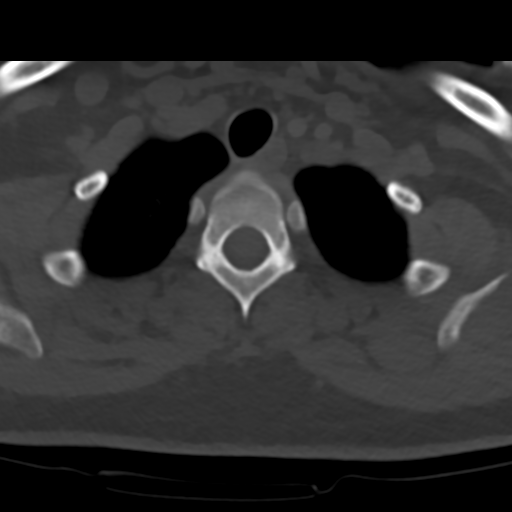
[im 33/97  bone]
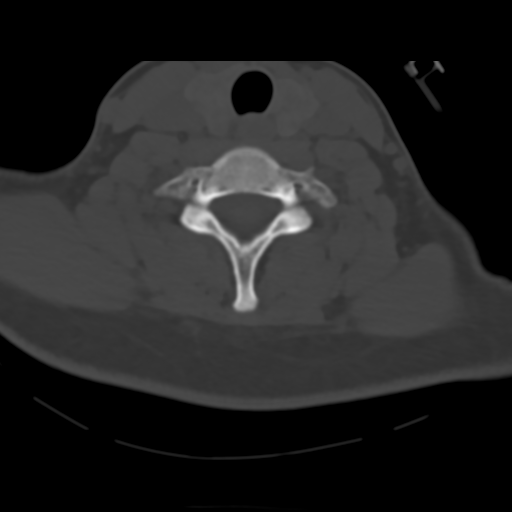
[im 65/97  bone]
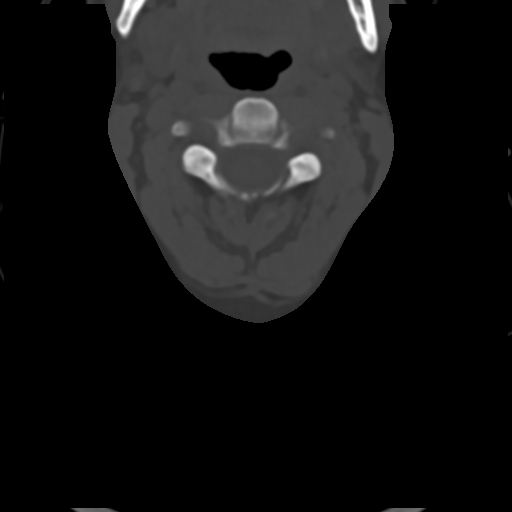
[im 81/97  bone]
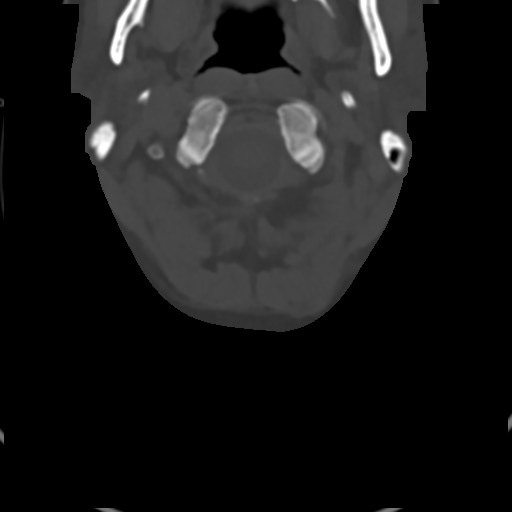

[Series 8: c_spine 2.0 sag bone · sagittal · 0.28mm/px · 5 of 61 slices shown, 6 images]
[im 21/61  bone]
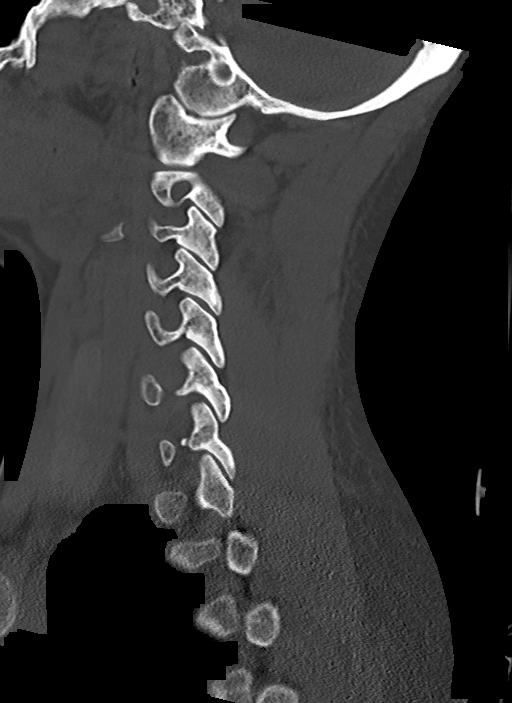
[im 26/61  bone]
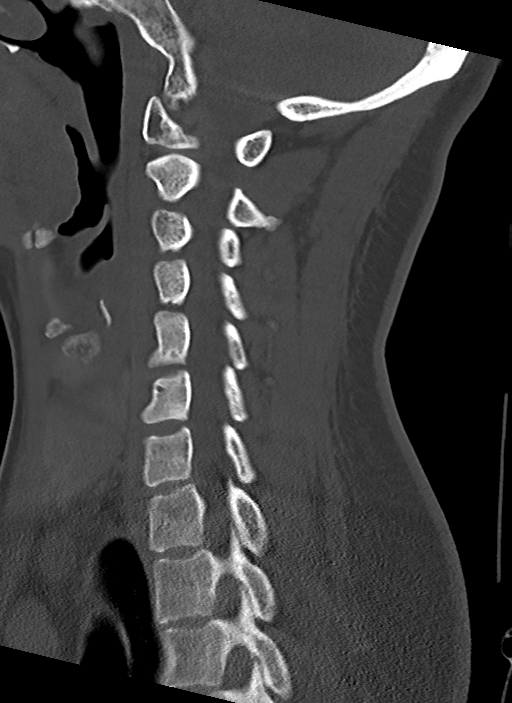
[im 31/61  soft-tissue]
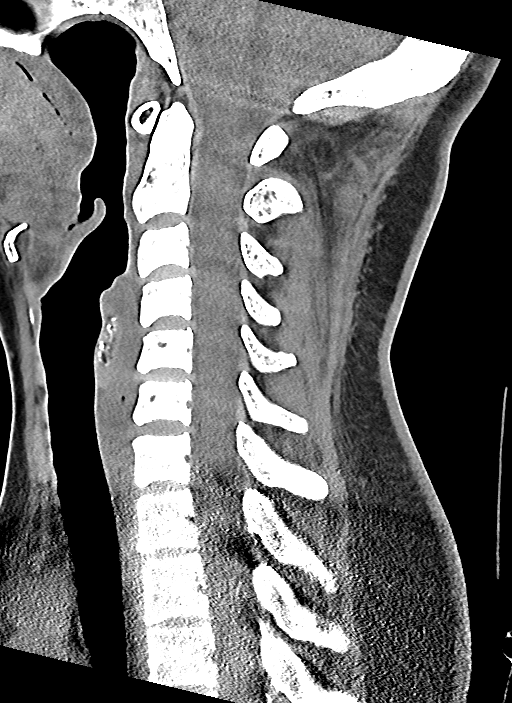
[im 31/61  bone]
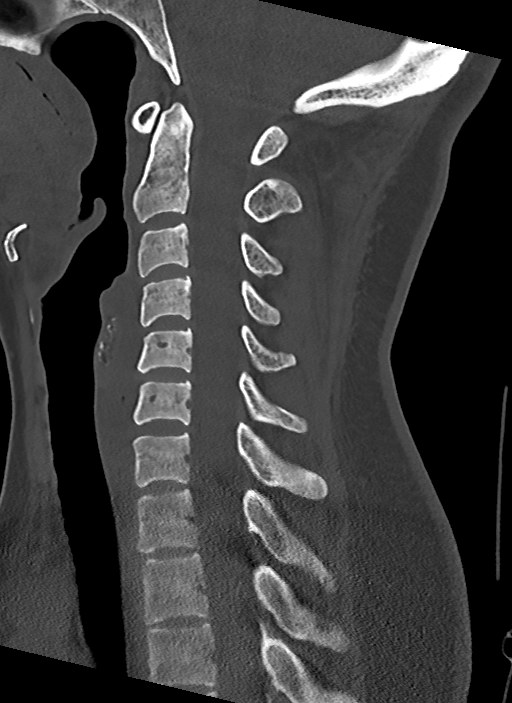
[im 36/61  bone]
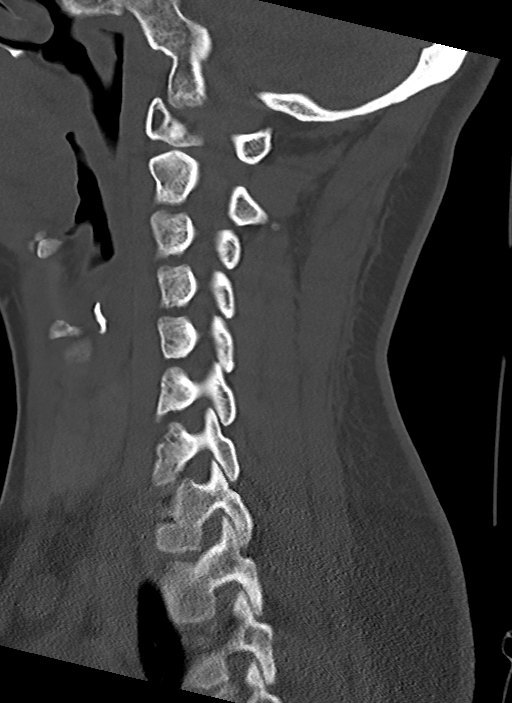
[im 41/61  bone]
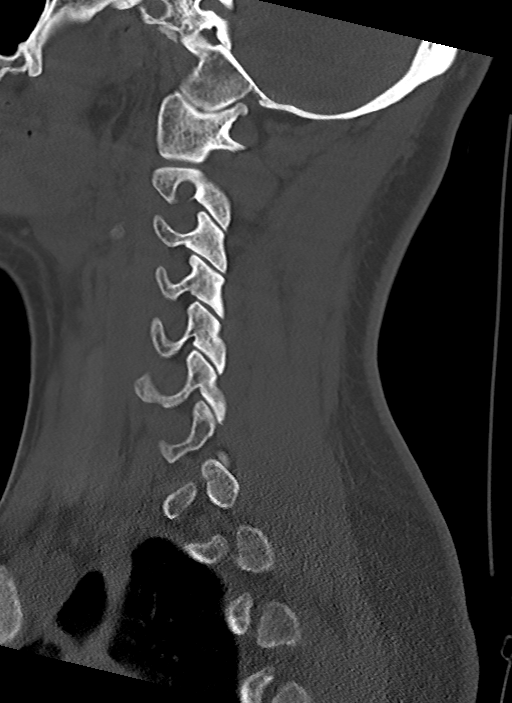

[Series 9: c_spine 2.0 cor bone · coronal · 0.28mm/px · 3 of 61 slices shown]
[im 13/61  bone]
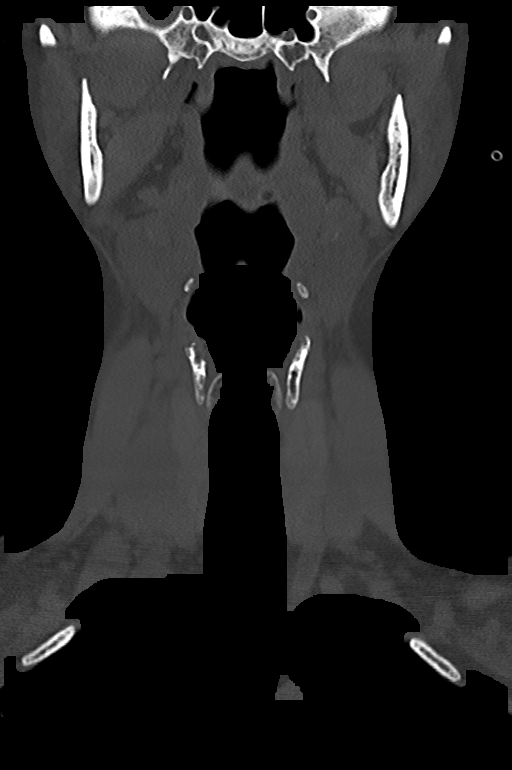
[im 25/61  bone]
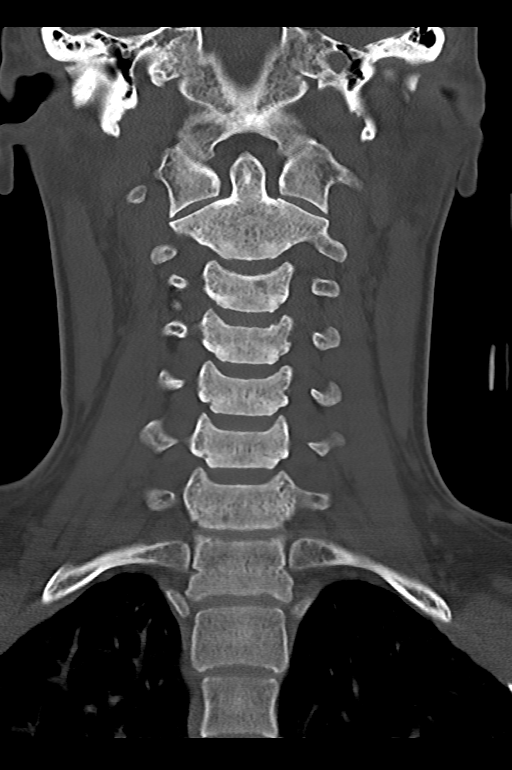
[im 37/61  bone]
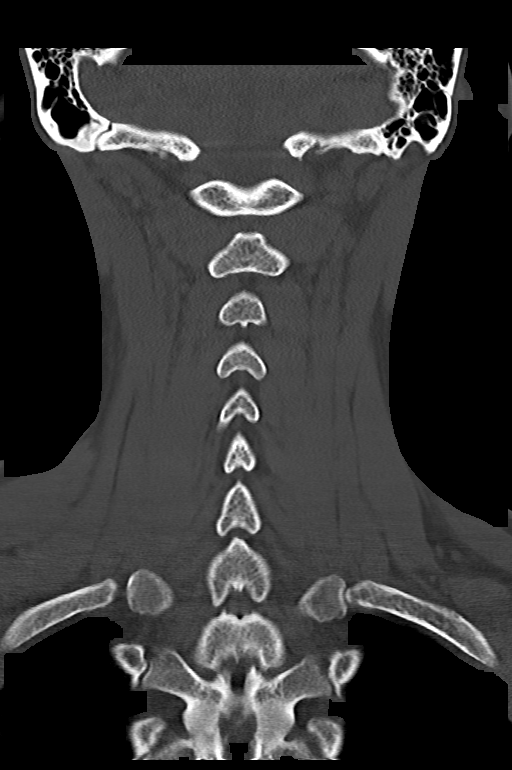

[12 of 33 positions shown; findings below may reference images not displayed]

FINDINGS: CT HEAD FINDINGS

Brain: Normal anatomic configuration. No abnormal intra or
extra-axial mass lesion or fluid collection. No abnormal mass effect
or midline shift. No evidence of acute intracranial hemorrhage or
infarct. Ventricular size is normal. Cerebellum unremarkable.

Vascular: Unremarkable

Skull: Intact

Sinuses/Orbits: Paranasal sinuses are clear. Orbits are
unremarkable.

Other: Mastoid air cells and middle ear cavities are clear.

CT CERVICAL SPINE FINDINGS

Alignment: Normal.

Skull base and vertebrae: No acute fracture. No primary bone lesion
or focal pathologic process.

Soft tissues and spinal canal: No prevertebral fluid or swelling. No
visible canal hematoma.

Disc levels: Intervertebral disc spaces are preserved. Prevertebral
soft tissues are not thickened. Spinal canal is widely patent. No
significant neuroforaminal narrowing

Upper chest: Unremarkable

Other: None
IMPRESSION: No acute intracranial abnormality.  No calvarial fracture.

No acute fracture or listhesis of the cervical spine.

## 2024-05-14 ENCOUNTER — Ambulatory Visit
Admission: EM | Admit: 2024-05-14 | Discharge: 2024-05-14 | Disposition: A | Discharge status: Home / Self Care | Source: Home / Self Care

## 2024-05-14 DIAGNOSIS — B349 Viral infection, unspecified: Secondary | ICD-10-CM

## 2024-05-14 DIAGNOSIS — R52 Pain, unspecified: Secondary | ICD-10-CM

## 2024-05-14 LAB — POC COVID19/FLU A&B COMBO
Covid Antigen, POC: NEGATIVE
Influenza A Antigen, POC: NEGATIVE
Influenza B Antigen, POC: NEGATIVE

## 2024-05-14 MED ORDER — CETIRIZINE HCL 10 MG PO TABS: 10.0000 mg | ORAL_TABLET | Freq: Every day | ORAL | 0 refills | Status: AC

## 2024-05-14 MED ORDER — PSEUDOEPHEDRINE HCL 30 MG PO TABS: 30.0000 mg | ORAL_TABLET | Freq: Three times a day (TID) | ORAL | 0 refills | Status: AC | PRN

## 2024-05-14 NOTE — ED Provider Notes (Signed)
 [facility]  Note:  This document was prepared using Conservation officer, historic buildings and may include unintentional dictation errors.  MRN: [id number] DOB: 04-Dec-1985  Subjective:   Terri Simpson is a 38 y.o. female presenting for 2-day history of malaise, fatigue, body pains, chills, scratchy throat this morning with some ear pain. No cough, chest pain, shob, wheezing.  Has allergic rhinitis.  No asthma.  No smoking of any kind including cigarettes, cigars, vaping, marijuana use.  Her employer wanted her to get tested for COVID and flu and provide documentation.  Current Outpatient Medications  Medication Instructions   acetaminophen  (TYLENOL ) 1,000 mg, Oral, Every 6 hours PRN   atorvastatin (LIPITOR) 40 mg, Oral, Daily   cholecalciferol (VITAMIN D3) 1,000 Units, Oral, Daily   cyanocobalamin (VITAMIN B12) 1,000 mcg, Oral, Daily   Homeopathic Products (ALLERGY MEDICINE PO) 1 tablet, Oral, Daily PRN   ibuprofen  (ADVIL ) 400 mg, Oral, Every 6 hours PRN   Multiple Vitamins-Minerals (HAIR SKIN AND NAILS FORMULA PO) 1 tablet, Oral, Daily   vitamin C 1,000 mg, Oral, Daily    Allergies[1]  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   impaired hearing     Past Surgical History:  Procedure Laterality Date   STAPEDECTOMY Left    STAPEDECTOMY Right 04/28/2014   Procedure: RIGHT STAPEDECTOMY;  Surgeon: Ida Loader, MD;  Location: [facility];  Service: ENT;  Laterality: Right;    Family History  Problem Relation Age of Onset   Breast cancer Neg Hx     Social History   Occupational History   Not on file  Tobacco Use   Smoking status: Never   Smokeless tobacco: Never  Vaping Use   Vaping status: Never Used  Substance and Sexual Activity   Alcohol use: No   Drug use: No   Sexual activity: Yes    Birth control/protection: I.U.D.      ROS   Objective:   Vitals: BP 116/62 (BP Location: Left Arm)   Pulse 83   Temp 98.5 F (36.9 C) (Oral)   Resp 16   SpO2 98%   Physical Exam Constitutional:      General: She is not in acute distress.    Appearance: Normal appearance. She is well-developed and normal weight. She is not ill-appearing, toxic-appearing or diaphoretic.  HENT:     Head: Normocephalic and atraumatic.     Right Ear: Tympanic membrane, ear canal and external ear normal. No drainage or tenderness. No middle ear effusion. There is no impacted cerumen. Tympanic membrane is not erythematous or bulging.     Left Ear: Tympanic membrane, ear canal and external ear normal. No drainage or tenderness.  No middle ear effusion. There is no impacted cerumen. Tympanic membrane is not erythematous or bulging.     Nose: Nose normal. No congestion or rhinorrhea.     Mouth/Throat:     Mouth: Mucous membranes are moist. No oral lesions.     Pharynx: No pharyngeal swelling, oropharyngeal exudate, posterior oropharyngeal erythema or uvula swelling.     Tonsils: No tonsillar exudate or tonsillar abscesses.  Eyes:     General: No scleral icterus.       Right eye: No discharge.        Left eye: No discharge.     Extraocular Movements: Extraocular movements intact.     Right eye: Normal extraocular  motion.     Left eye: Normal extraocular motion.     Conjunctiva/sclera: Conjunctivae normal.  Cardiovascular:     Rate and Rhythm: Normal rate and regular rhythm.     Heart sounds: Normal heart sounds. No murmur heard.    No friction rub. No gallop.  Pulmonary:     Effort: Pulmonary effort is normal. No respiratory distress.     Breath sounds: No stridor. No wheezing, rhonchi or rales.  Chest:     Chest wall: No tenderness.  Musculoskeletal:     Cervical back: Normal range of motion and neck supple.  Lymphadenopathy:     Cervical: No cervical adenopathy.  Skin:    General: Skin is warm and dry.  Neurological:      General: No focal deficit present.     Mental Status: She is alert and oriented to person, place, and time.  Psychiatric:        Mood and Affect: Mood normal.        Behavior: Behavior normal.     Results for orders placed or performed during the hospital encounter of 05/14/24 (from the past 24 hours)  POC Covid19/Flu A&B Antigen     Status: Normal   Collection Time: 05/14/24  7:10 PM  Result Value Ref Range   Influenza A Antigen, POC Negative Negative   Influenza B Antigen, POC Negative Negative   Covid Antigen, POC Negative Negative    Assessment and Plan :   PDMP not reviewed this encounter.  1. Acute viral syndrome   2. Body aches      Deferred imaging given clear cardiopulmonary exam, hemodynamically stable vital signs.  Suspect viral URI, viral syndrome. Physical exam findings reassuring and vital signs stable for discharge. Advised supportive care, offered symptomatic relief. Counseled patient on potential for adverse effects with medications prescribed/recommended today, ER and return-to-clinic precautions discussed, patient verbalized understanding.       [1] No Known Allergies    Christopher Savannah, [place] 05/14/24 1916

## 2024-05-14 NOTE — ED Triage Notes (Signed)
 Pt reports pain in the bones, low energy, chills x 2 day; raspy voice started today. Reports husband is sick. Taking Tylenol .   Pt requested COVID and Flu test.

## 2024-05-14 NOTE — Discharge Instructions (Signed)
 We will manage this as a viral illness, viral syndrome. For sore throat or cough try using a honey-based tea. Use 3 teaspoons of honey with juice squeezed from half lemon. Place shaved pieces of ginger into 1/2-1 cup of water and warm over stove top. Then mix the ingredients and repeat every 4 hours as needed. Please take ibuprofen 600mg  every 6 hours with food alternating with OR taken together with Tylenol 500mg -650mg  every 6 hours for throat pain, fevers, aches and pains. Hydrate very well with at least 2 liters of water. Eat light meals such as soups (chicken and noodles, vegetable, chicken and wild rice).  Do not eat foods that you are allergic to.  Taking an antihistamine like Zyrtec (10mg  daily) can help against postnasal drainage, sinus congestion which can cause sinus pain, sinus headaches, throat pain, painful swallowing, coughing.  You can take this together with pseudoephedrine (Sudafed) at a dose of 30mg  3 times a day or twice daily as needed for the same kind of nasal drip, congestion.

## 2024-06-15 ENCOUNTER — Ambulatory Visit
Admission: EM | Admit: 2024-06-15 | Discharge: 2024-06-15 | Disposition: A | Discharge status: Home / Self Care | Attending: Family Medicine | Admitting: Family Medicine

## 2024-06-15 DIAGNOSIS — J329 Chronic sinusitis, unspecified: Secondary | ICD-10-CM | POA: Diagnosis not present

## 2024-06-15 DIAGNOSIS — J40 Bronchitis, not specified as acute or chronic: Secondary | ICD-10-CM | POA: Diagnosis not present

## 2024-06-15 DIAGNOSIS — R52 Pain, unspecified: Secondary | ICD-10-CM

## 2024-06-15 MED ORDER — AMOXICILLIN-POT CLAVULANATE 875-125 MG PO TABS: 1.0000 | ORAL_TABLET | Freq: Two times a day (BID) | ORAL | 0 refills | Status: AC

## 2024-06-15 MED ORDER — PROMETHAZINE-DM 6.25-15 MG/5ML PO SYRP: 5.0000 mL | ORAL_SOLUTION | Freq: Three times a day (TID) | ORAL | 0 refills | Status: AC | PRN

## 2024-06-15 MED ORDER — PREDNISONE 20 MG PO TABS: ORAL_TABLET | ORAL | 0 refills | Status: AC

## 2024-06-15 NOTE — ED Provider Notes (Signed)
 " [facility]  Note:  This document was prepared using Conservation officer, historic buildings and may include unintentional dictation errors.  MRN: [id number] DOB: 1986/05/02  Subjective:   Terri Simpson is a 39 y.o. female presenting for 4 week history of chest congestion, fever, diarrhea, chills, vomiting, throat pain, body aches. This morning felt tingling of her hands, fingers, had significant lip swelling, sinus congestion.  She is now feeling chest pain and throat pain when she coughs.  Symptoms are worse at night especially with sinus congestion and throat congestion.  She did start taking Alka-seltzer in the past 1-2 days. No asthma. No smoking of any kind including cigarettes, cigars, vaping, marijuana use.  She was last seen on 05/14/2024 and tested negative for COVID, flu.  Reports that she had no change in her symptoms.  At times she feels worse in other days she feels just generally sick.  Current Outpatient Medications  Medication Instructions   acetaminophen  (TYLENOL ) 1,000 mg, Oral, Every 6 hours PRN   atorvastatin (LIPITOR) 40 mg, Oral, Daily   cetirizine  (ZYRTEC  ALLERGY) 10 mg, Oral, Daily   cholecalciferol (VITAMIN D3) 1,000 Units, Oral, Daily   cyanocobalamin (VITAMIN B12) 1,000 mcg, Oral, Daily   diphenhydrAMINE  (BENADRYL ) 25 mg, Every 6 hours PRN   Homeopathic Products (ALLERGY MEDICINE PO) 1 tablet, Oral, Daily PRN   ibuprofen  (ADVIL ) 400 mg, Oral, Every 6 hours PRN   Multiple Vitamins-Minerals (HAIR SKIN AND NAILS FORMULA PO) 1 tablet, Oral, Daily   pseudoephedrine  (SUDAFED) 30 mg, Oral, Every 8 hours PRN   vitamin C 1,000 mg, Oral, Daily    Allergies[1]  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   impaired hearing     Past Surgical History:  Procedure Laterality Date   STAPEDECTOMY Left    STAPEDECTOMY Right 04/28/2014   Procedure: RIGHT  STAPEDECTOMY;  Surgeon: Ida Loader, MD;  Location: [facility];  Service: ENT;  Laterality: Right;    Family History  Problem Relation Age of Onset   Breast cancer Neg Hx     Social History   Occupational History   Not on file  Tobacco Use   Smoking status: Never   Smokeless tobacco: Never  Vaping Use   Vaping status: Never Used  Substance and Sexual Activity   Alcohol use: No   Drug use: No   Sexual activity: Yes    Birth control/protection: I.U.D.     ROS   Objective:   Vitals: BP 109/65 (BP Location: Left Arm)   Pulse (!) 109   Temp 98.3 F (36.8 C) (Oral)   Resp 16   SpO2 98%   Physical Exam Constitutional:      General: She is not in acute distress.    Appearance: Normal appearance. She is well-developed and normal weight. She is not ill-appearing, toxic-appearing or diaphoretic.  HENT:     Head: Normocephalic and atraumatic.     Right Ear: Tympanic membrane, ear canal and external ear normal. No drainage or tenderness. No middle ear effusion. There is no impacted cerumen. Tympanic membrane is not erythematous or bulging.     Left Ear: Tympanic membrane, ear canal and external ear normal. No drainage or tenderness.  No middle ear effusion. There is no impacted cerumen. Tympanic membrane is not erythematous or bulging.     Nose: Congestion present. No rhinorrhea.  Mouth/Throat:     Mouth: Mucous membranes are moist. No oral lesions.     Pharynx: No pharyngeal swelling, oropharyngeal exudate, posterior oropharyngeal erythema or uvula swelling.     Tonsils: No tonsillar exudate or tonsillar abscesses. 0 on the right. 0 on the left.     Comments: Thick streaks of postnasal drainage overlying pharynx.  Eyes:     General: No scleral icterus.       Right eye: No discharge.        Left eye: No discharge.     Extraocular Movements: Extraocular movements intact.     Right eye: Normal extraocular motion.     Left eye: Normal extraocular motion.      Conjunctiva/sclera: Conjunctivae normal.  Cardiovascular:     Rate and Rhythm: Normal rate and regular rhythm.     Heart sounds: Normal heart sounds. No murmur heard.    No friction rub. No gallop.  Pulmonary:     Effort: Pulmonary effort is normal. No respiratory distress.     Breath sounds: No stridor. No wheezing, rhonchi or rales.  Chest:     Chest wall: No tenderness.  Abdominal:     General: Bowel sounds are normal. There is no distension.     Palpations: Abdomen is soft. There is no mass.     Tenderness: There is no abdominal tenderness. There is no right CVA tenderness, left CVA tenderness, guarding or rebound.  Musculoskeletal:     Cervical back: Normal range of motion and neck supple.  Lymphadenopathy:     Cervical: No cervical adenopathy.  Skin:    General: Skin is warm and dry.  Neurological:     General: No focal deficit present.     Mental Status: She is alert and oriented to person, place, and time.  Psychiatric:        Mood and Affect: Mood normal.        Behavior: Behavior normal.        Thought Content: Thought content normal.        Judgment: Judgment normal.     Assessment and Plan :   PDMP not reviewed this encounter.  1. Sinobronchitis   2. Body aches      Recommended imaging but patient refused this today to rule out pneumonia.  Reports that she would like to undergo a trial of medications and will return to clinic if she has not improved.  I advised treating for sinobronchitis with Augmentin  and prednisone .  Recommend supportive care otherwise.  Counseled patient on potential for adverse effects with medications prescribed/recommended today, ER and return-to-clinic precautions discussed, patient verbalized understanding.     [1] No Known Allergies    Christopher Savannah, PA-C 06/15/24 1435  "

## 2024-06-15 NOTE — Discharge Instructions (Signed)
 Start amoxicillin -clavulanate and prednisone  to help with your sinobronchitis infection. This is an antibiotic and steroid. Use cough syrup as you need to.

## 2024-06-15 NOTE — ED Triage Notes (Signed)
 Pt reports fever, diarrhea, chills, vomiting, cough, throat discomfort when coughing, chest congestion x 4 days; numbness in right thumb right index finger x 1 day. Pt reports her lips are swelling since this morning. Pt taking Alka seltzer plus.
